# Patient Record
Sex: Female | Born: 1970 | Race: Black or African American | Hispanic: No | Marital: Married | Smoking: Former smoker
Health system: Southern US, Community
[De-identification: ages and names within clinical notes are randomized; demographics above are authoritative.]

## PROBLEM LIST (undated history)

## (undated) DIAGNOSIS — D649 Anemia, unspecified: Secondary | ICD-10-CM

## (undated) DIAGNOSIS — E119 Type 2 diabetes mellitus without complications: Secondary | ICD-10-CM

## (undated) DIAGNOSIS — I10 Essential (primary) hypertension: Secondary | ICD-10-CM

## (undated) HISTORY — PX: WISDOM TOOTH EXTRACTION: SHX21

---

## 2018-03-01 DIAGNOSIS — E1159 Type 2 diabetes mellitus with other circulatory complications: Secondary | ICD-10-CM | POA: Insufficient documentation

## 2018-03-01 DIAGNOSIS — E1165 Type 2 diabetes mellitus with hyperglycemia: Secondary | ICD-10-CM | POA: Insufficient documentation

## 2018-03-01 DIAGNOSIS — I10 Essential (primary) hypertension: Secondary | ICD-10-CM | POA: Insufficient documentation

## 2018-03-01 DIAGNOSIS — Z794 Long term (current) use of insulin: Secondary | ICD-10-CM

## 2018-03-01 DIAGNOSIS — E119 Type 2 diabetes mellitus without complications: Secondary | ICD-10-CM

## 2018-03-01 HISTORY — DX: Type 2 diabetes mellitus without complications: Z79.4

## 2018-03-01 HISTORY — DX: Type 2 diabetes mellitus without complications: E11.9

## 2019-09-03 ENCOUNTER — Other Ambulatory Visit: Payer: Self-pay | Admitting: Obstetrics & Gynecology

## 2019-09-03 DIAGNOSIS — R1900 Intra-abdominal and pelvic swelling, mass and lump, unspecified site: Secondary | ICD-10-CM

## 2019-09-09 ENCOUNTER — Ambulatory Visit
Admission: RE | Admit: 2019-09-09 | Discharge: 2019-09-09 | Disposition: A | Discharge status: Home / Self Care | Payer: Managed Care, Other (non HMO) | Source: Ambulatory Visit | Attending: Obstetrics & Gynecology | Admitting: Obstetrics & Gynecology

## 2019-09-09 DIAGNOSIS — R1900 Intra-abdominal and pelvic swelling, mass and lump, unspecified site: Secondary | ICD-10-CM

## 2019-09-09 MED ORDER — IOPAMIDOL (ISOVUE-300) INJECTION 61%
100.0000 mL | Freq: Once | INTRAVENOUS | Status: AC | PRN
  Administered 2019-09-09: 100 mL via INTRAVENOUS

## 2019-09-20 ENCOUNTER — Other Ambulatory Visit: Payer: Self-pay | Admitting: Obstetrics & Gynecology

## 2019-09-20 DIAGNOSIS — N852 Hypertrophy of uterus: Secondary | ICD-10-CM

## 2019-10-07 ENCOUNTER — Other Ambulatory Visit: Payer: Self-pay

## 2019-10-07 ENCOUNTER — Ambulatory Visit
Admission: RE | Admit: 2019-10-07 | Discharge: 2019-10-07 | Disposition: A | Discharge status: Home / Self Care | Payer: Managed Care, Other (non HMO) | Source: Ambulatory Visit | Attending: Obstetrics & Gynecology | Admitting: Obstetrics & Gynecology

## 2019-10-07 DIAGNOSIS — N852 Hypertrophy of uterus: Secondary | ICD-10-CM

## 2019-10-07 MED ORDER — GADOBENATE DIMEGLUMINE 529 MG/ML IV SOLN
16.0000 mL | Freq: Once | INTRAVENOUS | Status: AC | PRN
  Administered 2019-10-07: 16 mL via INTRAVENOUS

## 2019-11-02 ENCOUNTER — Other Ambulatory Visit: Payer: Self-pay | Admitting: Obstetrics & Gynecology

## 2019-11-10 ENCOUNTER — Other Ambulatory Visit: Payer: Self-pay | Admitting: Obstetrics & Gynecology

## 2019-11-10 NOTE — Progress Notes (Signed)
PCP - Azucena Fallen, MD Cardiologist -   Chest x-ray -  EKG -  Stress Test -  ECHO -  Cardiac Cath -   Sleep Study -  CPAP -   Fasting Blood Sugar -  Checks Blood Sugar _____ times a day  Blood Thinner Instructions: Aspirin Instructions: Last Dose:  Anesthesia review:   Patient denies shortness of breath, fever, cough and chest pain at PAT appointment   Patient verbalized understanding of instructions that were given to them at the PAT appointment. Patient was also instructed that they will need to review over the PAT instructions again at home before surgery.

## 2019-11-10 NOTE — Patient Instructions (Addendum)
YOU ARE SCHEDULED FOR A COVID TEST 11-18-19@3 :30 PM. THIS TEST MUST BE DONE BEFORE SURGERY. GO TO  [address], [place], [address] AND REMAIN IN YOUR CAR, THIS IS A DRIVE UP TEST. ONCE YOUR COVID TEST IS DONE PLEASE FOLLOW ALL THE QUARANTINE  INSTRUCTIONS GIVEN IN YOUR HANDOUT.      Your procedure is scheduled on 11-22-19    Report to [place]. M.   Call this number if you have problems the morning of surgery  :[phone].   OUR ADDRESS IS [place].  WE ARE LOCATED IN [facility].                                     REMEMBER: DO NOT EAT FOOD OR DRINK LIQUIDS AFTER MIDNIGHT .  AFTER MIDNIGHT YOU MAY HAVE A CLEAR LIQUID DIET FROM MIDNIGHT UNTIL 7:15 AM, AFTER 7:15 AM, NOTHING UNTIL AFTER SURGERY.     CLEAR LIQUID DIET   Foods Allowed                                                                     Foods Excluded  Coffee and tea, regular and decaf                             liquids that you cannot  Plain Jell-O any favor except red or purple                                           see through such as: Fruit ices (not with fruit pulp)                                     milk, soups, orange juice  Iced Popsicles                                    All solid food Carbonated beverages, regular and diet                                    Cranberry, grape and apple juices Sports drinks like Gatorade Lightly seasoned clear broth or consume(fat free) Sugar, honey syrup   _____________________________________________________________________     YOU MAY  BRUSH YOUR TEETH MORNING OF SURGERY AND RINSE YOUR MOUTH OUT, NO CHEWING GUM CANDY OR MINTS.   TAKE THESE MEDICATIONS MORNING OF SURGERY WITH A SIP OF WATER:  Amlodipine (Norvas)  IF YOU ARE SPENDING THE NIGHT AFTER SURGERY PLEASE BRING ALL YOUR PRESCRIPTION MEDICATIONS IN THEIR ORIGINAL BOTTLES.   1 VISITOR IS ALLOWED IN WAITING ROOM ONLY DAY OF SURGERY.   NO  VISITOR MAY SPEND THE NIGHT. VISITOR ARE ALLOWED TO STAY UNTIL 800 PM.  DO NOT WEAR JEWERLY, MAKE UP, OR NAIL POLISH ON FINGERNAILS.  DO NOT WEAR LOTIONS, POWDERS, PERFUMES OR DEODORANT.  DO NOT SHAVE FOR 24 HOURS PRIOR TO DAY OF SURGERY.  CONTACTS, GLASSES, OR DENTURES MAY NOT BE WORN TO SURGERY.                                    [facility] IS NOT RESPONSIBLE  FOR ANY BELONGINGS.                                                                    Marland Kitchen     How to Manage Your Diabetes Before and After Surgery  Why is it important to control my blood sugar before and after surgery? . Improving blood sugar levels before and after surgery helps healing and can limit problems. . A way of improving blood sugar control is eating a healthy diet by: o  Eating less sugar and carbohydrates o  Increasing activity/exercise o  Talking with your doctor about reaching your blood sugar goals . High blood sugars (greater than 180 mg/dL) can raise your risk of infections and slow your recovery, so you will need to focus on controlling your diabetes during the weeks before surgery. . Make sure that the doctor who takes care of your diabetes knows about your planned surgery including the date and location.  How do I manage my blood sugar before surgery? . Check your blood sugar at least 4 times a day, starting 2 days before surgery, to make sure that the level is not too high or low. o Check your blood sugar the morning of your surgery when you wake up and every 2 hours until you get to the Short Stay unit. . If your blood sugar is less than 70 mg/dL, you will need to treat for low blood sugar: o Do not take insulin. o Treat a low blood sugar (less than 70 mg/dL) with  cup of clear juice (cranberry or apple), 4 glucose tablets, OR glucose gel. o Recheck blood sugar in 15 minutes after treatment (to make sure it is greater than 70 mg/dL). If your blood sugar is not greater  than 70 mg/dL on recheck, call [phone] for further instructions. . Report your blood sugar to the short stay nurse when you get to Short Stay.  . If you are admitted to the hospital after surgery: o Your blood sugar will be checked by the staff and you will probably be given insulin after surgery (instead of oral diabetes medicines) to make sure you have good blood sugar levels. o The goal for blood sugar control after surgery is 80-180 mg/dL.   WHAT DO I DO ABOUT MY DIABETES MEDICATION?  Marland Kitchen Do not take oral diabetes medicines (pills) the morning of surgery.  . THE DAY BEFORE SURGERY, take your usual 6-8 units of Basaglar and Toujeo insulin, if needed.       . THE MORNING OF SURGERY, take  3-4 units of Basaglar and Toujeo insulin if needed. .   Reviewed and Endorsed by [facility] Patient Education Committee, August 2015  [facility] - Preparing for Surgery Before surgery, you can play an important role.  Because skin is not sterile, your skin needs to be as free of germs as possible.  You can reduce the number of germs on your skin by washing with CHG (chlorahexidine gluconate) soap before surgery.  CHG is an antiseptic cleaner which kills germs and bonds with the skin to continue killing germs even after washing. Please DO NOT use if you have an allergy to CHG or antibacterial soaps.  If your skin becomes reddened/irritated stop using the CHG and inform your nurse when you arrive at Short Stay. Do not shave (including legs and underarms) for at least 48 hours prior to the first CHG shower.  You may shave your face/neck. Please follow these instructions carefully:  1.  Shower with CHG Soap the night before surgery and the  morning of Surgery.  2.  If you choose to wash your hair, wash your hair first as usual with your  normal  shampoo.  3.  After you shampoo, rinse your hair and body  thoroughly to remove the  shampoo.                           4.  Use CHG as you would any other liquid soap.  You can apply chg directly  to the skin and wash                       Gently with a scrungie or clean washcloth.  5.  Apply the CHG Soap to your body ONLY FROM THE NECK DOWN.   Do not use on face/ open                           Wound or open sores. Avoid contact with eyes, ears mouth and genitals (private parts).                       Wash face,  Genitals (private parts) with your normal soap.             6.  Wash thoroughly, paying special attention to the area where your surgery  will be performed.  7.  Thoroughly rinse your body with warm water from the neck down.  8.  DO NOT shower/wash with your normal soap after using and rinsing off  the CHG Soap.                9.  Pat yourself dry with a clean towel.            10.  Wear clean pajamas.            11.  Place clean sheets on your bed the night of your first shower and do not  sleep with pets. Day of Surgery : Do not apply any lotions/deodorants the morning of surgery.  Please wear clean clothes to the hospital/surgery center.  FAILURE TO FOLLOW THESE INSTRUCTIONS MAY RESULT IN THE CANCELLATION OF YOUR SURGERY PATIENT SIGNATURE_________________________________  NURSE SIGNATURE__________________________________  ________________________________________________________________________

## 2019-11-15 ENCOUNTER — Other Ambulatory Visit: Payer: Self-pay

## 2019-11-15 ENCOUNTER — Other Ambulatory Visit (HOSPITAL_COMMUNITY): Payer: Managed Care, Other (non HMO)

## 2019-11-15 ENCOUNTER — Encounter (HOSPITAL_COMMUNITY): Payer: Self-pay

## 2019-11-15 ENCOUNTER — Encounter (HOSPITAL_COMMUNITY)
Admission: RE | Admit: 2019-11-15 | Discharge: 2019-11-15 | Disposition: A | Discharge status: Home / Self Care | Payer: Managed Care, Other (non HMO) | Source: Ambulatory Visit | Attending: Obstetrics & Gynecology | Admitting: Obstetrics & Gynecology

## 2019-11-15 DIAGNOSIS — E118 Type 2 diabetes mellitus with unspecified complications: Secondary | ICD-10-CM | POA: Insufficient documentation

## 2019-11-15 DIAGNOSIS — D259 Leiomyoma of uterus, unspecified: Secondary | ICD-10-CM | POA: Insufficient documentation

## 2019-11-15 DIAGNOSIS — I10 Essential (primary) hypertension: Secondary | ICD-10-CM | POA: Diagnosis not present

## 2019-11-15 DIAGNOSIS — Z01818 Encounter for other preprocedural examination: Secondary | ICD-10-CM | POA: Diagnosis present

## 2019-11-15 HISTORY — DX: Anemia, unspecified: D64.9

## 2019-11-15 HISTORY — DX: Essential (primary) hypertension: I10

## 2019-11-15 HISTORY — DX: Type 2 diabetes mellitus without complications: E11.9

## 2019-11-15 LAB — ABO/RH: ABO/RH(D): A POS

## 2019-11-15 LAB — COMPREHENSIVE METABOLIC PANEL
ALT: 106 U/L — ABNORMAL HIGH (ref 0–44)
AST: 36 U/L (ref 15–41)
Albumin: 3.9 g/dL (ref 3.5–5.0)
Alkaline Phosphatase: 54 U/L (ref 38–126)
Anion gap: 8 (ref 5–15)
BUN: 16 mg/dL (ref 6–20)
CO2: 28 mmol/L (ref 22–32)
Calcium: 9.4 mg/dL (ref 8.9–10.3)
Chloride: 105 mmol/L (ref 98–111)
Creatinine, Ser: 0.83 mg/dL (ref 0.44–1.00)
GFR calc Af Amer: >60 mL/min (ref >60)
GFR calc non Af Amer: >60 mL/min (ref >60)
Glucose, Bld: 139 mg/dL — ABNORMAL HIGH (ref 70–99)
Potassium: 4.2 mmol/L (ref 3.5–5.1)
Sodium: 141 mmol/L (ref 135–145)
Total Bilirubin: 0.5 mg/dL (ref 0.3–1.2)
Total Protein: 7.4 g/dL (ref 6.5–8.1)

## 2019-11-15 LAB — CBC
HCT: 43.1 % (ref 36.0–46.0)
Hemoglobin: 13.6 g/dL (ref 12.0–15.0)
MCH: 26.3 pg (ref 26.0–34.0)
MCHC: 31.6 g/dL (ref 30.0–36.0)
MCV: 83.2 fL (ref 80.0–100.0)
Platelets: 389 10*3/uL (ref 150–400)
RBC: 5.18 MIL/uL — ABNORMAL HIGH (ref 3.87–5.11)
RDW: 19 % — ABNORMAL HIGH (ref 11.5–15.5)
WBC: 6.1 10*3/uL (ref 4.0–10.5)
nRBC: 0 % (ref 0.0–0.2)

## 2019-11-15 LAB — HEMOGLOBIN A1C
Hgb A1c MFr Bld: 8 % — ABNORMAL HIGH (ref 4.8–5.6)
Mean Plasma Glucose: 182.9 mg/dL

## 2019-11-15 LAB — GLUCOSE, CAPILLARY: Glucose-Capillary: 144 mg/dL — ABNORMAL HIGH (ref 70–99)

## 2019-11-16 ENCOUNTER — Other Ambulatory Visit: Payer: Self-pay | Admitting: Obstetrics & Gynecology

## 2019-11-18 ENCOUNTER — Other Ambulatory Visit (HOSPITAL_COMMUNITY)
Admission: RE | Admit: 2019-11-18 | Discharge: 2019-11-18 | Disposition: A | Discharge status: Home / Self Care | Payer: Managed Care, Other (non HMO) | Source: Ambulatory Visit | Attending: Obstetrics & Gynecology | Admitting: Obstetrics & Gynecology

## 2019-11-18 DIAGNOSIS — Z01812 Encounter for preprocedural laboratory examination: Secondary | ICD-10-CM | POA: Insufficient documentation

## 2019-11-18 DIAGNOSIS — Z20822 Contact with and (suspected) exposure to covid-19: Secondary | ICD-10-CM | POA: Diagnosis not present

## 2019-11-19 LAB — SARS CORONAVIRUS 2 (TAT 6-24 HRS): SARS Coronavirus 2: NEGATIVE

## 2019-11-22 ENCOUNTER — Encounter (HOSPITAL_BASED_OUTPATIENT_CLINIC_OR_DEPARTMENT_OTHER): Payer: Self-pay | Admitting: Obstetrics & Gynecology

## 2019-11-22 ENCOUNTER — Inpatient Hospital Stay (HOSPITAL_COMMUNITY)
Admission: AD | Admit: 2019-11-22 | Discharge: 2019-11-24 | DRG: 742 | Disposition: A | Discharge status: Home / Self Care | Payer: Managed Care, Other (non HMO) | Source: Ambulatory Visit | Attending: Obstetrics & Gynecology | Admitting: Obstetrics & Gynecology

## 2019-11-22 ENCOUNTER — Ambulatory Visit (HOSPITAL_BASED_OUTPATIENT_CLINIC_OR_DEPARTMENT_OTHER): Payer: Managed Care, Other (non HMO) | Admitting: Anesthesiology

## 2019-11-22 ENCOUNTER — Encounter (HOSPITAL_COMMUNITY)
Admission: AD | Disposition: A | Discharge status: Home / Self Care | Payer: Self-pay | Source: Ambulatory Visit | Attending: Obstetrics & Gynecology

## 2019-11-22 ENCOUNTER — Other Ambulatory Visit: Payer: Self-pay

## 2019-11-22 DIAGNOSIS — D252 Subserosal leiomyoma of uterus: Principal | ICD-10-CM | POA: Diagnosis present

## 2019-11-22 DIAGNOSIS — E119 Type 2 diabetes mellitus without complications: Secondary | ICD-10-CM | POA: Diagnosis present

## 2019-11-22 DIAGNOSIS — E861 Hypovolemia: Secondary | ICD-10-CM | POA: Diagnosis not present

## 2019-11-22 DIAGNOSIS — Z87891 Personal history of nicotine dependence: Secondary | ICD-10-CM

## 2019-11-22 DIAGNOSIS — Z794 Long term (current) use of insulin: Secondary | ICD-10-CM

## 2019-11-22 DIAGNOSIS — N179 Acute kidney failure, unspecified: Secondary | ICD-10-CM | POA: Diagnosis not present

## 2019-11-22 DIAGNOSIS — Y658 Other specified misadventures during surgical and medical care: Secondary | ICD-10-CM | POA: Diagnosis not present

## 2019-11-22 DIAGNOSIS — K660 Peritoneal adhesions (postprocedural) (postinfection): Secondary | ICD-10-CM | POA: Diagnosis present

## 2019-11-22 DIAGNOSIS — D259 Leiomyoma of uterus, unspecified: Secondary | ICD-10-CM | POA: Diagnosis present

## 2019-11-22 DIAGNOSIS — I9581 Postprocedural hypotension: Secondary | ICD-10-CM | POA: Diagnosis not present

## 2019-11-22 DIAGNOSIS — D62 Acute posthemorrhagic anemia: Secondary | ICD-10-CM | POA: Diagnosis not present

## 2019-11-22 DIAGNOSIS — Z9889 Other specified postprocedural states: Secondary | ICD-10-CM

## 2019-11-22 DIAGNOSIS — K9172 Accidental puncture and laceration of a digestive system organ or structure during other procedure: Secondary | ICD-10-CM | POA: Diagnosis not present

## 2019-11-22 DIAGNOSIS — I10 Essential (primary) hypertension: Secondary | ICD-10-CM | POA: Diagnosis present

## 2019-11-22 DIAGNOSIS — K388 Other specified diseases of appendix: Secondary | ICD-10-CM | POA: Diagnosis present

## 2019-11-22 HISTORY — PX: MYOMECTOMY: SHX85

## 2019-11-22 HISTORY — PX: APPENDECTOMY: SHX54

## 2019-11-22 LAB — CBC
HCT: 27.8 % — ABNORMAL LOW (ref 36.0–46.0)
HCT: 23.8 % — ABNORMAL LOW (ref 36.0–46.0)
Hemoglobin: 8.9 g/dL — ABNORMAL LOW (ref 12.0–15.0)
Hemoglobin: 7.6 g/dL — ABNORMAL LOW (ref 12.0–15.0)
MCH: 26.9 pg (ref 26.0–34.0)
MCH: 27 pg (ref 26.0–34.0)
MCHC: 32 g/dL (ref 30.0–36.0)
MCHC: 31.9 g/dL (ref 30.0–36.0)
MCV: 84 fL (ref 80.0–100.0)
MCV: 84.4 fL (ref 80.0–100.0)
Platelets: 340 10*3/uL (ref 150–400)
Platelets: 303 10*3/uL (ref 150–400)
RBC: 3.31 MIL/uL — ABNORMAL LOW (ref 3.87–5.11)
RBC: 2.82 MIL/uL — ABNORMAL LOW (ref 3.87–5.11)
RDW: 18.3 % — ABNORMAL HIGH (ref 11.5–15.5)
RDW: 18.3 % — ABNORMAL HIGH (ref 11.5–15.5)
WBC: 19.2 10*3/uL — ABNORMAL HIGH (ref 4.0–10.5)
WBC: 20.2 10*3/uL — ABNORMAL HIGH (ref 4.0–10.5)
nRBC: 0 % (ref 0.0–0.2)
nRBC: 0 % (ref 0.0–0.2)

## 2019-11-22 LAB — BASIC METABOLIC PANEL
Anion gap: 11 (ref 5–15)
BUN: 15 mg/dL (ref 6–20)
CO2: 22 mmol/L (ref 22–32)
Calcium: 8.1 mg/dL — ABNORMAL LOW (ref 8.9–10.3)
Chloride: 105 mmol/L (ref 98–111)
Creatinine, Ser: 0.77 mg/dL (ref 0.44–1.00)
GFR calc Af Amer: >60 mL/min (ref >60)
GFR calc non Af Amer: >60 mL/min (ref >60)
Glucose, Bld: 239 mg/dL — ABNORMAL HIGH (ref 70–99)
Potassium: 3.7 mmol/L (ref 3.5–5.1)
Sodium: 138 mmol/L (ref 135–145)

## 2019-11-22 LAB — POCT I-STAT, CHEM 8
BUN: 17 mg/dL (ref 6–20)
Calcium, Ion: 1.26 mmol/L (ref 1.15–1.40)
Chloride: 102 mmol/L (ref 98–111)
Creatinine, Ser: 0.8 mg/dL (ref 0.44–1.00)
Glucose, Bld: 97 mg/dL (ref 70–99)
HCT: 43 % (ref 36.0–46.0)
Hemoglobin: 14.6 g/dL (ref 12.0–15.0)
Potassium: 3.9 mmol/L (ref 3.5–5.1)
Sodium: 140 mmol/L (ref 135–145)
TCO2: 29 mmol/L (ref 22–32)

## 2019-11-22 LAB — LACTIC ACID, PLASMA: Lactic Acid, Venous: 2.2 mmol/L (ref 0.5–1.9)

## 2019-11-22 LAB — GLUCOSE, CAPILLARY
Glucose-Capillary: 111 mg/dL — ABNORMAL HIGH (ref 70–99)
Glucose-Capillary: 167 mg/dL — ABNORMAL HIGH (ref 70–99)
Glucose-Capillary: 248 mg/dL — ABNORMAL HIGH (ref 70–99)

## 2019-11-22 LAB — POCT PREGNANCY, URINE: Preg Test, Ur: NEGATIVE

## 2019-11-22 LAB — PREPARE RBC (CROSSMATCH)

## 2019-11-22 SURGERY — MYOMECTOMY, ABDOMINAL APPROACH
Anesthesia: General | Site: Abdomen

## 2019-11-22 MED ORDER — PHENYLEPHRINE 40 MCG/ML (10ML) SYRINGE FOR IV PUSH (FOR BLOOD PRESSURE SUPPORT)
PREFILLED_SYRINGE | INTRAVENOUS | Status: AC
  Filled 2019-11-22: qty 10

## 2019-11-22 MED ORDER — KETOROLAC TROMETHAMINE 30 MG/ML IJ SOLN
30.0000 mg | Freq: Once | INTRAMUSCULAR | Status: AC
  Administered 2019-11-22: 30 mg via INTRAVENOUS
  Filled 2019-11-22: qty 1

## 2019-11-22 MED ORDER — ACETAMINOPHEN 325 MG PO TABS: 650.0000 mg | ORAL_TABLET | ORAL | Status: DC | PRN

## 2019-11-22 MED ORDER — LIDOCAINE 2% (20 MG/ML) 5 ML SYRINGE
INTRAMUSCULAR | Status: AC
  Filled 2019-11-22: qty 5

## 2019-11-22 MED ORDER — LACTATED RINGERS IV BOLUS: 500.0000 mL | Freq: Once | INTRAVENOUS | Status: DC

## 2019-11-22 MED ORDER — BUPIVACAINE-EPINEPHRINE (PF) 0.5% -1:200000 IJ SOLN
INTRAMUSCULAR | Status: DC | PRN
  Administered 2019-11-22: 30 mL

## 2019-11-22 MED ORDER — FENTANYL CITRATE (PF) 250 MCG/5ML IJ SOLN
INTRAMUSCULAR | Status: AC
  Filled 2019-11-22: qty 5

## 2019-11-22 MED ORDER — DEXAMETHASONE SODIUM PHOSPHATE 10 MG/ML IJ SOLN
INTRAMUSCULAR | Status: AC
  Filled 2019-11-22: qty 1

## 2019-11-22 MED ORDER — MIDAZOLAM HCL 2 MG/2ML IJ SOLN
INTRAMUSCULAR | Status: DC | PRN
  Administered 2019-11-22: 2 mg via INTRAVENOUS

## 2019-11-22 MED ORDER — LIDOCAINE-EPINEPHRINE 2 %-1:100000 IJ SOLN
INTRAMUSCULAR | Status: DC | PRN
  Administered 2019-11-22: 20 mL

## 2019-11-22 MED ORDER — DOCUSATE SODIUM 100 MG PO CAPS
100.0000 mg | ORAL_CAPSULE | Freq: Two times a day (BID) | ORAL | Status: DC
  Administered 2019-11-23 – 2019-11-24 (×3): 100 mg via ORAL
  Filled 2019-11-22 (×3): qty 1

## 2019-11-22 MED ORDER — DEXAMETHASONE SODIUM PHOSPHATE 10 MG/ML IJ SOLN
INTRAMUSCULAR | Status: DC | PRN
  Administered 2019-11-22: 4 mg via INTRAVENOUS

## 2019-11-22 MED ORDER — ONDANSETRON HCL 4 MG PO TABS: 4.0000 mg | ORAL_TABLET | Freq: Four times a day (QID) | ORAL | Status: DC | PRN

## 2019-11-22 MED ORDER — ACETAMINOPHEN 325 MG PO TABS
650.0000 mg | ORAL_TABLET | Freq: Once | ORAL | Status: AC
  Administered 2019-11-23: 650 mg via ORAL
  Filled 2019-11-22 (×2): qty 2

## 2019-11-22 MED ORDER — HYDROMORPHONE HCL 1 MG/ML IJ SOLN: 0.5000 mg | INTRAMUSCULAR | Status: DC | PRN

## 2019-11-22 MED ORDER — PROPOFOL 10 MG/ML IV BOLUS
INTRAVENOUS | Status: DC | PRN
  Administered 2019-11-22: 100 mg via INTRAVENOUS

## 2019-11-22 MED ORDER — PHENYLEPHRINE 40 MCG/ML (10ML) SYRINGE FOR IV PUSH (FOR BLOOD PRESSURE SUPPORT)
PREFILLED_SYRINGE | INTRAVENOUS | Status: DC | PRN
  Administered 2019-11-22: 80 ug via INTRAVENOUS
  Administered 2019-11-22 (×2): 120 ug via INTRAVENOUS
  Administered 2019-11-22: 80 ug via INTRAVENOUS

## 2019-11-22 MED ORDER — ONDANSETRON HCL 4 MG/2ML IJ SOLN
INTRAMUSCULAR | Status: AC
  Filled 2019-11-22: qty 2

## 2019-11-22 MED ORDER — ROCURONIUM BROMIDE 10 MG/ML (PF) SYRINGE
PREFILLED_SYRINGE | INTRAVENOUS | Status: DC | PRN
  Administered 2019-11-22: 100 mg via INTRAVENOUS

## 2019-11-22 MED ORDER — CEFAZOLIN SODIUM-DEXTROSE 2-4 GM/100ML-% IV SOLN
INTRAVENOUS | Status: AC
  Filled 2019-11-22: qty 100

## 2019-11-22 MED ORDER — INSULIN GLARGINE 100 UNIT/ML ~~LOC~~ SOLN
6.0000 [IU] | Freq: Every day | SUBCUTANEOUS | Status: DC
  Filled 2019-11-22 (×2): qty 0.06

## 2019-11-22 MED ORDER — FENTANYL CITRATE (PF) 100 MCG/2ML IJ SOLN
INTRAMUSCULAR | Status: DC | PRN
  Administered 2019-11-22 (×3): 50 ug via INTRAVENOUS
  Administered 2019-11-22: 100 ug via INTRAVENOUS

## 2019-11-22 MED ORDER — ALBUMIN HUMAN 5 % IV SOLN: INTRAVENOUS | Status: DC | PRN

## 2019-11-22 MED ORDER — MENTHOL 3 MG MT LOZG: 1.0000 | LOZENGE | OROMUCOSAL | Status: DC | PRN

## 2019-11-22 MED ORDER — AMLODIPINE BESYLATE 5 MG PO TABS
2.5000 mg | ORAL_TABLET | Freq: Every day | ORAL | Status: DC
  Administered 2019-11-24: 2.5 mg via ORAL
  Filled 2019-11-22: qty 1

## 2019-11-22 MED ORDER — ONDANSETRON HCL 4 MG/2ML IJ SOLN: 4.0000 mg | Freq: Four times a day (QID) | INTRAMUSCULAR | Status: DC | PRN

## 2019-11-22 MED ORDER — MEPERIDINE HCL 25 MG/ML IJ SOLN
6.2500 mg | INTRAMUSCULAR | Status: DC | PRN
  Filled 2019-11-22: qty 1

## 2019-11-22 MED ORDER — LACTATED RINGERS IV SOLN: INTRAVENOUS | Status: DC

## 2019-11-22 MED ORDER — VASOPRESSIN 20 UNIT/ML IV SOLN
INTRAVENOUS | Status: DC | PRN
  Administered 2019-11-22: 40 [IU]

## 2019-11-22 MED ORDER — BASAGLAR KWIKPEN 100 UNIT/ML ~~LOC~~ SOPN: 6.0000 [IU] | PEN_INJECTOR | Freq: Every day | SUBCUTANEOUS | Status: DC

## 2019-11-22 MED ORDER — FENTANYL CITRATE (PF) 100 MCG/2ML IJ SOLN
INTRAMUSCULAR | Status: AC
  Filled 2019-11-22: qty 2

## 2019-11-22 MED ORDER — SODIUM CHLORIDE 0.9% IV SOLUTION
Freq: Once | INTRAVENOUS | Status: AC
  Filled 2019-11-22: qty 250

## 2019-11-22 MED ORDER — HYDROMORPHONE HCL 1 MG/ML IJ SOLN
INTRAMUSCULAR | Status: AC
  Filled 2019-11-22: qty 1

## 2019-11-22 MED ORDER — PROPOFOL 10 MG/ML IV BOLUS
INTRAVENOUS | Status: AC
  Filled 2019-11-22: qty 20

## 2019-11-22 MED ORDER — DIPHENHYDRAMINE HCL 50 MG/ML IJ SOLN
25.0000 mg | Freq: Once | INTRAMUSCULAR | Status: DC
  Filled 2019-11-22: qty 1
  Filled 2019-11-22: qty 0.5

## 2019-11-22 MED ORDER — LIDOCAINE 2% (20 MG/ML) 5 ML SYRINGE
INTRAMUSCULAR | Status: DC | PRN
  Administered 2019-11-22: 100 mg via INTRAVENOUS

## 2019-11-22 MED ORDER — KETOROLAC TROMETHAMINE 30 MG/ML IJ SOLN
INTRAMUSCULAR | Status: AC
  Filled 2019-11-22: qty 1

## 2019-11-22 MED ORDER — MIDAZOLAM HCL 2 MG/2ML IJ SOLN
INTRAMUSCULAR | Status: AC
  Filled 2019-11-22: qty 2

## 2019-11-22 MED ORDER — MIDAZOLAM HCL 2 MG/2ML IJ SOLN
2.0000 mg | Freq: Once | INTRAMUSCULAR | Status: AC
  Administered 2019-11-22: 2 mg via INTRAVENOUS
  Filled 2019-11-22: qty 2

## 2019-11-22 MED ORDER — SUGAMMADEX SODIUM 200 MG/2ML IV SOLN
INTRAVENOUS | Status: DC | PRN
  Administered 2019-11-22: 160 mg via INTRAVENOUS

## 2019-11-22 MED ORDER — FENTANYL CITRATE (PF) 100 MCG/2ML IJ SOLN
50.0000 ug | Freq: Once | INTRAMUSCULAR | Status: AC
  Administered 2019-11-22: 50 ug via INTRAVENOUS
  Filled 2019-11-22: qty 1

## 2019-11-22 MED ORDER — TRANEXAMIC ACID-NACL 1000-0.7 MG/100ML-% IV SOLN
1000.0000 mg | INTRAVENOUS | Status: AC
  Administered 2019-11-22: 1000 mg via INTRAVENOUS
  Filled 2019-11-22 (×3): qty 100

## 2019-11-22 MED ORDER — ONDANSETRON HCL 4 MG/2ML IJ SOLN
4.0000 mg | Freq: Once | INTRAMUSCULAR | Status: DC | PRN
  Filled 2019-11-22: qty 2

## 2019-11-22 MED ORDER — HYDROMORPHONE HCL 1 MG/ML IJ SOLN
0.2500 mg | INTRAMUSCULAR | Status: DC | PRN
  Administered 2019-11-22: 0.25 mg via INTRAVENOUS
  Filled 2019-11-22: qty 0.5

## 2019-11-22 MED ORDER — ONDANSETRON HCL 4 MG/2ML IJ SOLN
INTRAMUSCULAR | Status: DC | PRN
  Administered 2019-11-22 (×2): 4 mg via INTRAVENOUS

## 2019-11-22 MED ORDER — CEFAZOLIN SODIUM-DEXTROSE 2-4 GM/100ML-% IV SOLN
2.0000 g | INTRAVENOUS | Status: AC
  Administered 2019-11-22: 2 g via INTRAVENOUS
  Filled 2019-11-22: qty 100

## 2019-11-22 MED ORDER — TRANEXAMIC ACID-NACL 1000-0.7 MG/100ML-% IV SOLN
INTRAVENOUS | Status: DC | PRN
  Administered 2019-11-22: 1000 mg via INTRAVENOUS

## 2019-11-22 MED ORDER — OXYCODONE HCL 5 MG PO TABS: 5.0000 mg | ORAL_TABLET | ORAL | Status: DC | PRN

## 2019-11-22 MED ORDER — LACTATED RINGERS IV SOLN
INTRAVENOUS | Status: DC
  Filled 2019-11-22: qty 1000

## 2019-11-22 MED ORDER — ROCURONIUM BROMIDE 10 MG/ML (PF) SYRINGE
PREFILLED_SYRINGE | INTRAVENOUS | Status: AC
  Filled 2019-11-22: qty 10

## 2019-11-22 SURGICAL SUPPLY — 52 items
BARRIER ADHS 3X4 INTERCEED (GAUZE/BANDAGES/DRESSINGS) ×2 IMPLANT
BENZOIN TINCTURE PRP APPL 2/3 (GAUZE/BANDAGES/DRESSINGS) ×2 IMPLANT
BLADE SURG 10 STRL SS (BLADE) ×4 IMPLANT
CANISTER SUCT 3000ML PPV (MISCELLANEOUS) ×3 IMPLANT
CLOSURE STERI-STRIP 1/2X4 (GAUZE/BANDAGES/DRESSINGS) ×1
CLOSURE WOUND 1/2 X4 (GAUZE/BANDAGES/DRESSINGS) ×2
CLSR STERI-STRIP ANTIMIC 1/2X4 (GAUZE/BANDAGES/DRESSINGS) ×1 IMPLANT
CUTTER FLEX LINEAR 45M (STAPLE) ×2 IMPLANT
DECANTER SPIKE VIAL GLASS SM (MISCELLANEOUS) ×3 IMPLANT
DRAIN PENROSE 0.5X18 (DRAIN) ×2 IMPLANT
DRAPE CESAREAN BIRTH W POUCH (DRAPES) ×3 IMPLANT
DRSG OPSITE POSTOP 4X10 (GAUZE/BANDAGES/DRESSINGS) ×2 IMPLANT
DURAPREP 26ML APPLICATOR (WOUND CARE) ×3 IMPLANT
ELECT NDL TIP 2.8 STRL (NEEDLE) IMPLANT
ELECT NEEDLE TIP 2.8 STRL (NEEDLE) IMPLANT
GAUZE 4X4 16PLY RFD (DISPOSABLE) IMPLANT
GLOVE BIO SURGEON STRL SZ7 (GLOVE) ×3 IMPLANT
GLOVE BIOGEL PI IND STRL 7.0 (GLOVE) ×2 IMPLANT
GLOVE BIOGEL PI INDICATOR 7.0 (GLOVE) ×4
GOWN STRL REUS W/ TWL LRG LVL3 (GOWN DISPOSABLE) ×3 IMPLANT
GOWN STRL REUS W/TWL LRG LVL3 (GOWN DISPOSABLE) ×6
HEMOSTAT ARISTA ABSORB 3G PWDR (HEMOSTASIS) ×4 IMPLANT
KIT TURNOVER CYSTO (KITS) ×3 IMPLANT
NDL FILTER BLUNT 18X1 1/2 (NEEDLE) ×1 IMPLANT
NEEDLE FILTER BLUNT 18X 1/2SAF (NEEDLE) ×2
NEEDLE FILTER BLUNT 18X1 1/2 (NEEDLE) ×1 IMPLANT
NEEDLE HYPO 22GX1.5 SAFETY (NEEDLE) ×6 IMPLANT
NS IRRIG 1000ML POUR BTL (IV SOLUTION) ×3 IMPLANT
PACK ABDOMINAL GYN (CUSTOM PROCEDURE TRAY) ×3 IMPLANT
PAD OB MATERNITY 4.3X12.25 (PERSONAL CARE ITEMS) ×3 IMPLANT
RELOAD 45 VASCULAR/THIN (ENDOMECHANICALS) ×3 IMPLANT
RELOAD STAPLE 45 2.5 WHT GRN (ENDOMECHANICALS) IMPLANT
SPONGE LAP 18X18 RF (DISPOSABLE) ×2 IMPLANT
STAPLER VISISTAT 35W (STAPLE) IMPLANT
STRIP CLOSURE SKIN 1/2X4 (GAUZE/BANDAGES/DRESSINGS) ×2 IMPLANT
SUT MON AB 2-0 CT1 36 (SUTURE) ×3 IMPLANT
SUT MON AB 2-0 SH 27 (SUTURE) ×2
SUT MON AB 2-0 SH27 (SUTURE) ×1 IMPLANT
SUT MON AB 3-0 SH 27 (SUTURE)
SUT MON AB 3-0 SH27 (SUTURE) IMPLANT
SUT SILK 2 0 SH CR/8 (SUTURE) ×4 IMPLANT
SUT SILK 3 0 SH CR/8 (SUTURE) ×2 IMPLANT
SUT VIC AB 0 CT1 27 (SUTURE) ×8
SUT VIC AB 0 CT1 27XBRD ANBCTR (SUTURE) ×2 IMPLANT
SUT VIC AB 0 CT1 27XCR 8 STRN (SUTURE) IMPLANT
SUT VIC AB 0 CT1 36 (SUTURE) ×4 IMPLANT
SUT VIC AB 3-0 CT1 36 (SUTURE) ×2 IMPLANT
SUT VIC AB 4-0 KS 27 (SUTURE) ×3 IMPLANT
SYR BULB IRRIGATION 50ML (SYRINGE) ×2 IMPLANT
SYR CONTROL 10ML LL (SYRINGE) ×6 IMPLANT
TOWEL OR 17X26 10 PK STRL BLUE (TOWEL DISPOSABLE) ×6 IMPLANT
TRAY FOLEY W/BAG SLVR 14FR (SET/KITS/TRAYS/PACK) ×3 IMPLANT

## 2019-11-22 NOTE — Anesthesia Preprocedure Evaluation (Signed)
Anesthesia Evaluation  Patient identified by MRN, date of birth, ID band Patient awake    Reviewed: Allergy & Precautions, NPO status , Patient's Chart, lab work & pertinent test results  Airway Mallampati: I  TM Distance: >3 FB Neck ROM: Full    Dental   Pulmonary former smoker,    Pulmonary exam normal        Cardiovascular hypertension, Pt. on medications Normal cardiovascular exam     Neuro/Psych    GI/Hepatic   Endo/Other  diabetes, Type 2, Insulin Dependent  Renal/GU      Musculoskeletal   Abdominal   Peds  Hematology   Anesthesia Other Findings   Reproductive/Obstetrics                             Anesthesia Physical Anesthesia Plan  ASA: II  Anesthesia Plan: General   Post-op Pain Management:  Regional for Post-op pain   Induction: Intravenous  PONV Risk Score and Plan: 3 and Ondansetron, Midazolam and Treatment may vary due to age or medical condition  Airway Management Planned: Oral ETT  Additional Equipment:   Intra-op Plan:   Post-operative Plan: Extubation in OR  Informed Consent: I have reviewed the patients History and Physical, chart, labs and discussed the procedure including the risks, benefits and alternatives for the proposed anesthesia with the patient or authorized representative who has indicated his/her understanding and acceptance.       Plan Discussed with: CRNA and Surgeon  Anesthesia Plan Comments:         Anesthesia Quick Evaluation

## 2019-11-22 NOTE — Progress Notes (Signed)
Assisted Dr. Conrad Kenton Vale with ultrasound guided, transabdominal plane block. Side rails up, monitors on throughout procedure. See vital signs in flow sheet. Tolerated Procedure well.

## 2019-11-22 NOTE — Op Note (Signed)
11/22/2019  3:58 PM  PATIENT:  Sylvia Robinson  49 y.o. female  PRE-OPERATIVE DIAGNOSIS:  Uterine Fibroids  POST-OPERATIVE DIAGNOSIS:  Uterine Fibroids  PROCEDURE:  Procedure(s): ABDOMINAL MYOMECTOMY - Dr Benjie Karvonen & Dr Ronita Hipps APPENDECTOMY AND REPAIR OF Small bowel MESENTARY DEFECT - Dr Redmond Pulling  SURGEON:  Surgeon(s): Azucena Fallen, MD Brien Few, MD Greer Pickerel, MD  ASSISTANTS: Dr Benjie Karvonen   ANESTHESIA:   general  DRAINS: none   LOCAL MEDICATIONS USED:  OTHER see dr Gardiner Coins op note  SPECIMEN:  Source of Specimen:  appendix (my portion)  DISPOSITION OF SPECIMEN:  PATHOLOGY  COUNTS:  YES  INDICATION FOR PROCEDURE: 49 year old female who was undergoing an open abdominal myomectomy for a very large uterine fibroid.  The uterine fibroid was displacing the distal small bowel and the appendix.  Per the gynecologist the uterine fibroid was densely tethered and adhered to the distal terminal ileum posterior mesentery as well as the appendix.  In freeing the uterine fibroid there was some bleeding from the mesoappendix as well as from the distal small bowel mesentery and I was called in to evaluate the bowel.  Therefore this was an intraoperative consultation.  PROCEDURE: Please see Dr. Gardiner Coins operative note for additional details.  Upon my arrival the uterine fibroid had been removed and they were closing the uterus.  There was a clamp on the mesoappendix.  I then scrubbed in.  Identified the cecum and the distal small bowel.  The cecum was well perfused.  There is no evidence of ischemia to the distal small bowel.  There was a 6 cm defect in the small bowel mesentery in the distal ileum without vascular compromise to the bowel.  There was some oozing along the posterior mesentery in the same location and the peritoneum of the mesentery had been disrupted along the posterior veil with some oozing of in that area.  I continue to run the small bowel back proximately 150 to 200 cm.  There is  no evidence of enterotomy or bowel injury.  I decided to repair the mesenteric defect in the distal small bowel in order to prevent an internal hernia.  The mesenteric defect was closed with several figure-of-eight 2-0 silk sutures.  We then lifted up the distal small bowel and reflected and looked at the posterior distal small bowel mesentery where the peritoneum had been disrupted.  There is some oozing in 1 location I obtained hemostasis with a 2-0 silk suture.  I then took the clamp off the mesoappendix.  The majority of the blood supply to the appendix had been compromised.  I decided to go ahead and perform appendectomy.  The remaining mesoappendix was taken down with a Claiborne Billings and tied proximally with a 2-0 silk suture.  The appendix was then divided from the cecum with an laparoscopic Endo GIA 45 mm stapler with a white load.  There was a little bit of oozing from the proximal staple line and this was oversewn with a interrupted 3-0 silk suture.  There was excellent hemostasis.  I then turned my attention to the distal small bowel where we closed the mesenteric defect.  Posteriorly there is still little bit oozing at the base of the distal small bowel mesentery.  There is no overt vessel that was exposed or bleeding.  Just some raw oozing.  We placed Arista surgical powder and then I brought up the peritoneal flap and sutured it back to the posterior small bowel mesentery with several interrupted silk sutures.  We then reinspected the bowel.  There is no evidence of vascular compromise of the distal small bowel.  At this point I scrubbed out and the gynecologist proceeded with closure.  Please see the operative note for additional details.  I discussed the surgical findings with the husband and rationale for performing appendectomy as well as the mesenteric closure.  I recommended checking a CBC postoperatively and if the patient had a low hemoglobin that the patient would probably need to be transferred to  [facility] main hospital for overnight observation as opposed to staying in [facility] surgical center  PLAN OF CARE: Admit for overnight observation  PATIENT DISPOSITION:  PACU - hemodynamically stable.   Delay start of Pharmacological VTE agent (>24hrs) due to surgical blood loss or risk of bleeding:  yes  Leighton Ruff. Redmond Pulling, MD, FACS General, Bariatric, & Minimally Invasive Surgery [facility], [place]

## 2019-11-22 NOTE — Progress Notes (Addendum)
Came to assess pt for Hypotension and tachycardia. Last Dilaudid given at 5.45 pm 0.25 mg Pt oriented but sleepy, responds to verbal conversation appropriately. Reports bladder pressure, urinary urgency. No CP/ SOB  Patient Vitals for the past 24 hrs:  BP Temp Pulse Resp SpO2  11/22/19 2015 97/71 -- (!) 110 (!) 29 100 %  11/22/19 2000 101/73 -- (!) 111 (!) 28 100 %  11/22/19 1945 99/71 -- (!) 109 (!) 31 100 %  11/22/19 1930 93/74 -- (!) 110 (!) 29 100 %  11/22/19 1915 100/71 -- (!) 110 (!) 30 100 %  11/22/19 1900 111/76 -- (!) 114 17 100 %  11/22/19 1845 111/77 -- (!) 109 (!) 26 100 %  11/22/19 1830 123/83 -- (!) 109 (!) 31 100 %  11/22/19 1815 122/80 98.5 F (36.9 C) (!) 109 (!) 26 99 %  11/22/19 1800 122/79 -- (!) 111 (!) 27 99 %  11/22/19 1745 139/81 -- (!) 105 19 99 %  11/22/19 1730 (!) 144/91 -- 100 (!) 22 99 %  11/22/19 1715 (!) 154/91 -- 95 (!) 24 100 %  11/22/19 1700 (!) 170/93 -- 97 19 100 %  11/22/19 1645 (!) 170/92 -- 97 20 100 %  11/22/19 1630 136/77 98.3 F (36.8 C) 95 15 100 %  11/22/19 1210 (!) 172/101 -- 92 15 100 %  11/22/19 1205 (!) 176/99 -- 95 20 100 %  11/22/19 1200 (!) 186/110 -- (!) 105 15 100 %  11/22/19 1155 (!) 190/104 -- 98 (!) 24 100 %  11/22/19 1150 (!) 197/102 -- 99 (!) 22 100 %   O2 sat on 2lit [place]  Intake/Output Summary (Last 24 hours) at 11/22/2019 2032 Last data filed at 11/22/2019 1830 Gross per 24 hour  Intake 2900 ml  Output 1825 ml  Net 1075 ml   CBC Latest Ref Rng & Units 11/22/2019 11/22/2019 11/15/2019  WBC 4.0 - 10.5 K/uL 19.2(H) - 6.1  Hemoglobin 12.0 - 15.0 g/dL 8.9(L) 14.6 13.6  Hematocrit 36.0 - 46.0 % 27.8(L) 43.0 43.1  Platelets 150 - 400 K/uL 340 - 389    BMP Latest Ref Rng & Units 11/22/2019 11/22/2019 11/15/2019  Glucose 70 - 99 mg/dL 239(H) 97 139(H)  BUN 6 - 20 mg/dL 15 17 16   Creatinine 0.44 - 1.00 mg/dL 0.77 0.80 0.83  Sodium 135 - 145 mmol/L 138 140 141  Potassium 3.5 - 5.1 mmol/L 3.7 3.9 4.2  Chloride 98 - 111 mmol/L 105  102 105  CO2 22 - 32 mmol/L 22 - 28  Calcium 8.9 - 10.3 mg/dL 8.1(L) - 9.4   Exam- Lungs CTA bilateral CV Tachycardia Abdo soft, appropriately tender in lower abdomen, no rebound/ guarding. Active BS Extr SCDs on  A/P: Post-op hypotension and tachycardia. Urine op adequate and Creatinine nl. Intra-op blood loss was about 800 cc, Hgb drop 4 points. Possible intra-abdominal bleeding vs hypovolemia from surgical blood loss.  Will transfer to Acute care unit- Telemetry 4 East and will continue to follow VS and I/O and assessments Transfuse 2 units pRBCs now. Order CT with contrast if no improvement in status  Pt informed of new finding and plan and husband informed by call.   Critical care time 1 hr  -V.Benjie Karvonen, MD

## 2019-11-22 NOTE — Op Note (Addendum)
PRE-OPERATIVE DIAGNOSIS:  Uterine Fibroids   POST-OPERATIVE DIAGNOSIS:  Multiple Uterine Fibroids with a very large subserosal myoma and 3 smaller myomas. Terminal ileum and appendiceal adhesions to fibroid  PROCEDURE:  Abdominal Myomectomy, enterolysis - Dr Sylvia Robinson and Dr Sylvia Robinson                            Repair of mesentric defect and appendicectomy by Dr Sylvia Robinson  SURGEON: Sylvia Royals, MD Co-Surgeon: Sylvia Few, MD  Consultant: General Surgeon, Dr Sylvia Robinson  ANESTHESIA:  General endotracheal and Pre-op TAP block   EBL: 800 cc  IVF: 2200 cc LR          500 cc Albumin  Urine output: urine in foley: 550 cc clear   BLOOD ADMINISTERED: None   DRAINS: Urinary Catheter (Foley)   LOCAL MEDICATIONS USED:  MARCAINE 0.25% 10 cc skin infiltration     SPECIMEN: Morcellated pieces of one large fibroid and 3 other small fibroids   DISPOSITION OF SPECIMEN:  PATHOLOGY  COUNTS:  YES  PATIENT DISPOSITION:  PACU - hemodynamically stable.    Delay start of Pharmacological VTE agent (>24hrs) due to surgical blood loss or risk of bleeding: yes  PROCEDURE:   Indication:  Symptomatic uterine fibroids with abdominal mass and discomfort. Uterine preservation was desired for future childbearing. Risks/ complications reviewed and patient gave informed written consent.  Patient received TAP block before moving back to the OR. Patient was brought to the operating room with IV running. She received 2 gm Ancef. Underwent general anesthesia without difficulty and was given dorsal supine position, prepped and draped in sterile fashion. Foley catheter was placed. Large abdominal mass reaching left ribs noted. Large pfannenstiel incision was made with scalpel and carried down to the underlying fascia with Bovie with excellent hemostasis.  Fascia incised and extended laterally. Fascia grasped with Kocher's and underlying rectus muscles were dissected down. Rectus muscles were separated in midline.  Posterior rectus sheath and posterior peritoneum was grasped with mosquitoes and peritoneal entry made.  Large fibroid uterus was palpated. Uterus was well defined with 3 subserosal myomas. The largest pedunculated myoma was filling the entire abdomen and was adherent to appendix and terminal ileum's mesentery on the right and was on a thick stalk arising from right fundal aspect with large blood vessels. Both tubes and ovaries were normal.  Penrose drain placed around the stalk of the large myoma and tightened. A small subserosal pedunculated myoma from left anterior aspect was grasped, vasopressin injected in the base and stalk was excised and myoma handed off. Bleeding cauterized and deep sutures on 0-Vicryl placed to cover myoma dead space, followed by serosa with 3-0 Vicryl. Now the large myoma (23x17 x20 cm) was injected with dilute vasopressin, especially the base of the stalk. Two Kelly clamps placed across the stalk and Myoma was excised, Interrupted deep figure of eight stitches placed on uterine incision for hemostasis and then serosal closed using 3-0 Vicryl. Pedunculated disconnected myoma was large and could not be delivered through the incision, so it was morcellated with knife and large pieces removed and collected for pathology. We didn't have any pieces fall in the abdominal cavity. After debulking significant amount, right side was reached where terminal ileum mesosalpinx and appendix were stuck. Enterolysis performed through clear windows and bowel loop freed from the fibroid. Appendix was freed in similar fashion. Bleeding from mesoappendix was clamped with Mosquito clamp.  General surgeon consult was requested to  assess bowel and mesentery and appendix. See Dr Sylvia Robinson note for details. But essentially, he repaired the tear in the mesentery and performed appendicectomy and ran bowel, no bowel injury was noted. Posterior peritoneal bleeding noted where mesentery was adherent to posterior  peritoneum, hemostasis achieved with a single stitch followed by Arista spray and then re approximating  the peritoneal tear to control bleeding. Dr Sylvia Robinson stepped out at this point.  We completed one more myomectomy from posterior right cornual area, sutured the myoma space for hemostasis and then serosa was sutured with 3-0 Vicryl. Peritoneal washing done. No active bleeding noted. Lap sponges removed. Arista sprayed on myomectomy suture lines and they were covered with single large piece of Interceed.  Abdomen was closed. Peritoneal edges grasped and peritoneum closed with 2-0 Vicryl. Fascia sutured with 0 Vicryl. Subcutaneous layer closed with 2-0 Plain gut. Skin approximated with 4-0 Vicryl in subcuticular fashion.  Steristrips and Honeycomb dressing placed. All  Counts were correct x2.. Patient was brought to PACU extubated and stable.  Surgical findings discussed with her husband.   --Sylvia Moat MD

## 2019-11-22 NOTE — H&P (Signed)
Sylvia Robinson is an 49 y.o. female G0. Here for abdominal myomectomy, Declines hysterectomy for future child bearing option. Patient presented to office for infertility intervention, didn't report abdominal mass until examination was performed and mass discussed. She denies dysmenorrhea/ menorrhagia/ dyspareunia/ abdominal pain or bladder or bowel changes. After discussing mass, she acknowledges clothes getting tighter, thought was weight gain. She didn't have any idea she has fibroids, she has not had an exam in a few yrs.  She was also diagnosed with DM and HTN at visit, she is aware of it but didn't follow up with doctor as she felt well.  She is married and desires pregnancy. Her spouse's semen analysis was very poor with azoospermia and she was advised of that. She was also counseled on low chances of spontaneous pregnancy at this age and AMA risks but she does not wish to discuss that further. She agreed to blood transfusion if needed.   Patient's last menstrual period was 11/10/2019 (exact date).   Past Medical History:  Diagnosis Date  . Anemia   . Diabetes mellitus without complication ([place])   . Hypertension     Past Surgical History:  Procedure Laterality Date  . WISDOM TOOTH EXTRACTION      History reviewed. No pertinent family history.  Social History:  reports that she has quit smoking. She has never used smokeless tobacco. She reports that she does not drink alcohol or use drugs.  Allergies: No Known Allergies  Medications Prior to Admission  Medication Sig Dispense Refill Last Dose  . amLODipine (NORVASC) 2.5 MG tablet Take 2.5 mg by mouth daily.   11/22/2019 at 0630  . Cholecalciferol (VITAMIN D3) 50 MCG (2000 UT) TABS Take 2,000 Units by mouth daily.   11/21/2019 at Unknown time  . FERREX 150 150 MG capsule Take 150 mg by mouth daily.   11/21/2019 at Unknown time  . ibuprofen (ADVIL) 200 MG tablet Take 200 mg by mouth every 6 (six) hours as needed for headache.    Past Month at Unknown time  . Insulin Glargine (BASAGLAR KWIKPEN) 100 UNIT/ML Inject 6-8 Units into the skin daily as needed.   11/21/2019 at Unknown time  . Prenatal Vit-Fe Fumarate-FA (PRENATAL MULTIVITAMIN) TABS tablet Take 1 tablet by mouth daily at 12 noon.   11/21/2019 at Unknown time  . TOUJEO SOLOSTAR 300 UNIT/ML Solostar Pen Inject 6-8 Units into the skin daily as needed.   11/21/2019 at Unknown time    Review of Systems  Blood pressure (!) 172/101, pulse 92, resp. rate 15, last menstrual period 11/10/2019, SpO2 100 %. Physical Exam A&O x 3, no acute distress. Pleasant HEENT neg, no thyromegaly Lungs CTA bilat CV RRR, S1S2 normal Abdo Large 26 cm- 28 cm mass of fibroids  Extr no edema/ tenderness Pelvic above    Results for orders placed or performed during the hospital encounter of 11/22/19 (from the past 24 hour(s))  Pregnancy, urine POC     Status: None   Collection Time: 11/22/19 10:41 AM  Result Value Ref Range   Preg Test, Ur NEGATIVE NEGATIVE  I-STAT, chem 8     Status: None   Collection Time: 11/22/19 11:49 AM  Result Value Ref Range   Sodium 140 135 - 145 mmol/L   Potassium 3.9 3.5 - 5.1 mmol/L   Chloride 102 98 - 111 mmol/L   BUN 17 6 - 20 mg/dL   Creatinine, Ser 0.80 0.44 - 1.00 mg/dL   Glucose, Bld 97 70 - 99  mg/dL   Calcium, Ion 1.26 1.15 - 1.40 mmol/L   TCO2 29 22 - 32 mmol/L   Hemoglobin 14.6 12.0 - 15.0 g/dL   HCT 43.0 36.0 - 46.0 %    FINDINGS: COMBINED FINDINGS FOR BOTH MR ABDOMEN AND PELVIS  Lower chest: No acute findings.  Hepatobiliary: No mass or other parenchymal abnormality identified.  Pancreas: No mass, inflammatory changes, or other parenchymal abnormality identified.  Spleen:  Within normal limits in size and appearance.  Adrenals/Urinary Tract:  Normal appearance of the adrenal glands.  Multiple cysts are identified within the left kidney. These measure up to 1.6 cm. On the subtraction images there is no  convincing evidence for enhancement within these lesions. Numerous smaller, less than 1 cm and too small to reliably characterize left kidney lesions are also noted.  Within the inferior pole of the right kidney there is a lesion arising from the medial cortex measuring 1.7 cm. This is mildly T2 hyperintense, T1 isointense. Without internal enhancement on the subtraction images.  Right-sided pelvocaliectasis is identified, which likely reflects mass effect from large uterine fibroids. Bladder is unremarkable.  Stomach/Bowel: Visualized portions within the abdomen are unremarkable.  Vascular/Lymphatic: No pathologically enlarged lymph nodes identified. No abdominal aortic aneurysm demonstrated.  Reproductive:  Uterus: Enlarged fibroid uterus measures 23.8 by 23.2 by 13.1 cm (volume = 3790 cm^3). Multiple fibroids are identified:  -dominant index fibroid arising from the right uterine fundus measures 23.3 x 17.1 by 13 cm. (Volume = 2700 cm^3) central area of degeneration with calcification is identified measuring 6.8 cm. Diffuse heterogeneous enhancement following IV contrast administration.  -partially exophytic subserosal fibroid arising from the right side of fundus measures 5.9 x 4.4 by 5.6 cm, image 15/2 and image 24/4. Enhances following contrast.  -subserosal fibroid arising from the right lateral wall of the uterus measures 5.7 by 5.6 by 5.3 cm. Enhances following IV contrast.  Endometrium: Normal trilaminar appearance of the endometrium. The large uterine fibroids has mass effect upon the endometrial cavity which is deviated towards the left side of pelvis. No sub mucosal abnormality identified.  Right ovary: Measures 3.2 x 1.6 by 1.9 cm. No adnexal mass identified.  Left ovary: Measures 4.0 by 2.1 by 2.9 cm. No adnexal mass identified.  Musculoskeletal: No suspicious bone lesions identified.  IMPRESSION: 1. Large fibroid uterus. This has a  volume of 3790 cc. There are 3 enhancing fibroids identified. The largest arises from the right-sided the fundus with a volume of 2700 cc. 2. Multiple kidney cysts are identified compatible with Bosniak category 1 and 2 lesions. No suspicious kidney lesions identified at this time. 3. Right-sided pelvocaliectasis secondary to mass effect from enlarged uterine fibroids.  Assessment/Plan: Large fibroid uterus. Here for abdominal myomectomy.  Risks/complications of surgery reviewed incl infection, bleeding, damage to internal organs including bladder, bowels, ureters, blood vessels, other risks from anesthesia, VTE and delayed complications of any surgery, complications in future surgery reviewed. Reviewed possible blood transfusion. Reviewed possible hysterectomy risk and she wants to make sure I do everything to avoid that, we agreed. Reviewed future new fibroids and needing another myomectomy or hysterectomy and she understands.  Informed written consent obtained.   Elveria Royals 11/22/2019, 12:53 PM

## 2019-11-22 NOTE — Anesthesia Procedure Notes (Signed)
Procedure Name: Intubation Date/Time: 11/22/2019 1:43 PM Performed by: Mechele Claude, CRNA Pre-anesthesia Checklist: Patient identified, Emergency Drugs available, Suction available and Patient being monitored Patient Re-evaluated:Patient Re-evaluated prior to induction Oxygen Delivery Method: Circle system utilized Preoxygenation: Pre-oxygenation with 100% oxygen Induction Type: IV induction Ventilation: Mask ventilation without difficulty Laryngoscope Size: Mac and 3 Grade View: Grade II Tube type: Oral Tube size: 7.5 mm Number of attempts: 1 Airway Equipment and Method: Stylet and Oral airway Placement Confirmation: ETT inserted through vocal cords under direct vision,  positive ETCO2 and breath sounds checked- equal and bilateral Secured at: 21 cm Tube secured with: Tape Dental Injury: Teeth and Oropharynx as per pre-operative assessment

## 2019-11-22 NOTE — Anesthesia Procedure Notes (Signed)
Anesthesia Regional Block: TAP block   Pre-Anesthetic Checklist: ,, timeout performed, Correct Patient, Correct Site, Correct Laterality, Correct Procedure, Correct Position, site marked, Risks and benefits discussed,  Surgical consent,  Pre-op evaluation,  At surgeon's request and post-op pain management  Laterality: Left and Right  Prep: chloraprep, alcohol swabs       Needles:  Injection technique: Single-shot  Needle Type: Echogenic Stimulator Needle     Needle Length: 9cm  Needle Gauge: 21     Additional Needles:   Narrative:  Start time: 11/22/2019 12:04 PM End time: 11/22/2019 12:14 PM Injection made incrementally with aspirations every 5 mL.  Performed by: Personally  Anesthesiologist: Lillia Abed, MD  Additional Notes: Monitors applied. Patient sedated. Sterile prep and drape,hand hygiene and sterile gloves were used.On both sides - relevant anatomy identified and needle position confirmed.Local anesthetic injected incrementally after negative aspiration. Local anesthetic spread visualized in Transversus Abdominus Plane bilaterally. Vascular puncture avoided. No complications. Images printed for medical record.The patient tolerated the procedure well.

## 2019-11-22 NOTE — Transfer of Care (Signed)
Immediate Anesthesia Transfer of Care Note  Patient: Aryiah Nuanez  Procedure(s) Performed: ABDOMINAL MYOMECTOMY, ENTEROLYSIS (N/A Abdomen) APPENDECTOMY AND REPAIR OF MESENTARY DEFECT (N/A Abdomen)  Patient Location: PACU  Anesthesia Type:General  Level of Consciousness: sedated  Airway & Oxygen Therapy: Patient Spontanous Breathing and Patient connected to face mask oxygen  Post-op Assessment: pt awake in OR, given Fentanyl prior to transport.  Apneic on arrival.  Easily ventilated with Ambu bag with O2Sat up to 100% rapidly.  OPA inserted for approx 5 minutes. BS now with OPA removed, awake and responding. Report given to RN  Post vital signs: Reviewed and stable  Last Vitals:  Vitals Value Taken Time  BP 151/85 11/22/19 1630  Temp    Pulse 102 11/22/19 1638  Resp 28 11/22/19 1638  SpO2 99 % 11/22/19 1638  Vitals shown include unvalidated device data.  Last Pain:  Vitals:   11/22/19 1630  PainSc: 0-No pain         Complications: No apparent anesthesia complications

## 2019-11-23 DIAGNOSIS — Y658 Other specified misadventures during surgical and medical care: Secondary | ICD-10-CM | POA: Diagnosis not present

## 2019-11-23 DIAGNOSIS — D252 Subserosal leiomyoma of uterus: Secondary | ICD-10-CM | POA: Diagnosis present

## 2019-11-23 DIAGNOSIS — D62 Acute posthemorrhagic anemia: Secondary | ICD-10-CM | POA: Diagnosis not present

## 2019-11-23 DIAGNOSIS — K660 Peritoneal adhesions (postprocedural) (postinfection): Secondary | ICD-10-CM | POA: Diagnosis present

## 2019-11-23 DIAGNOSIS — I10 Essential (primary) hypertension: Secondary | ICD-10-CM | POA: Diagnosis present

## 2019-11-23 DIAGNOSIS — E861 Hypovolemia: Secondary | ICD-10-CM | POA: Diagnosis not present

## 2019-11-23 DIAGNOSIS — E119 Type 2 diabetes mellitus without complications: Secondary | ICD-10-CM | POA: Diagnosis present

## 2019-11-23 DIAGNOSIS — Z794 Long term (current) use of insulin: Secondary | ICD-10-CM | POA: Diagnosis not present

## 2019-11-23 DIAGNOSIS — K9172 Accidental puncture and laceration of a digestive system organ or structure during other procedure: Secondary | ICD-10-CM | POA: Diagnosis not present

## 2019-11-23 DIAGNOSIS — N179 Acute kidney failure, unspecified: Secondary | ICD-10-CM | POA: Diagnosis not present

## 2019-11-23 DIAGNOSIS — I9581 Postprocedural hypotension: Secondary | ICD-10-CM | POA: Diagnosis not present

## 2019-11-23 DIAGNOSIS — Z87891 Personal history of nicotine dependence: Secondary | ICD-10-CM | POA: Diagnosis not present

## 2019-11-23 DIAGNOSIS — K388 Other specified diseases of appendix: Secondary | ICD-10-CM | POA: Diagnosis present

## 2019-11-23 LAB — CBC WITH DIFFERENTIAL/PLATELET
Abs Immature Granulocytes: 0.08 10*3/uL — ABNORMAL HIGH (ref 0.00–0.07)
Basophils Absolute: 0 10*3/uL (ref 0.0–0.1)
Basophils Relative: 0 %
Eosinophils Absolute: 0 10*3/uL (ref 0.0–0.5)
Eosinophils Relative: 0 %
HCT: 34.4 % — ABNORMAL LOW (ref 36.0–46.0)
Hemoglobin: 11.3 g/dL — ABNORMAL LOW (ref 12.0–15.0)
Immature Granulocytes: 1 %
Lymphocytes Relative: 8 %
Lymphs Abs: 1.4 10*3/uL (ref 0.7–4.0)
MCH: 27.8 pg (ref 26.0–34.0)
MCHC: 32.8 g/dL (ref 30.0–36.0)
MCV: 84.7 fL (ref 80.0–100.0)
Monocytes Absolute: 2.3 10*3/uL — ABNORMAL HIGH (ref 0.1–1.0)
Monocytes Relative: 13 %
Neutro Abs: 13.4 10*3/uL — ABNORMAL HIGH (ref 1.7–7.7)
Neutrophils Relative %: 78 %
Platelets: 222 10*3/uL (ref 150–400)
RBC: 4.06 MIL/uL (ref 3.87–5.11)
RDW: 16 % — ABNORMAL HIGH (ref 11.5–15.5)
WBC: 17.2 10*3/uL — ABNORMAL HIGH (ref 4.0–10.5)
nRBC: 0 % (ref 0.0–0.2)

## 2019-11-23 LAB — COMPREHENSIVE METABOLIC PANEL
ALT: 25 U/L (ref 0–44)
AST: 26 U/L (ref 15–41)
Albumin: 3.1 g/dL — ABNORMAL LOW (ref 3.5–5.0)
Alkaline Phosphatase: 36 U/L — ABNORMAL LOW (ref 38–126)
Anion gap: 11 (ref 5–15)
BUN: 28 mg/dL — ABNORMAL HIGH (ref 6–20)
CO2: 22 mmol/L (ref 22–32)
Calcium: 7.8 mg/dL — ABNORMAL LOW (ref 8.9–10.3)
Chloride: 103 mmol/L (ref 98–111)
Creatinine, Ser: 1.77 mg/dL — ABNORMAL HIGH (ref 0.44–1.00)
GFR calc Af Amer: 39 mL/min — ABNORMAL LOW (ref >60)
GFR calc non Af Amer: 34 mL/min — ABNORMAL LOW (ref >60)
Glucose, Bld: 239 mg/dL — ABNORMAL HIGH (ref 70–99)
Potassium: 4.2 mmol/L (ref 3.5–5.1)
Sodium: 136 mmol/L (ref 135–145)
Total Bilirubin: 0.9 mg/dL (ref 0.3–1.2)
Total Protein: 5.7 g/dL — ABNORMAL LOW (ref 6.5–8.1)

## 2019-11-23 LAB — GLUCOSE, CAPILLARY
Glucose-Capillary: 238 mg/dL — ABNORMAL HIGH (ref 70–99)
Glucose-Capillary: 287 mg/dL — ABNORMAL HIGH (ref 70–99)
Glucose-Capillary: 177 mg/dL — ABNORMAL HIGH (ref 70–99)
Glucose-Capillary: 74 mg/dL (ref 70–99)

## 2019-11-23 LAB — CBC
HCT: 28.9 % — ABNORMAL LOW (ref 36.0–46.0)
Hemoglobin: 9.7 g/dL — ABNORMAL LOW (ref 12.0–15.0)
MCH: 27.9 pg (ref 26.0–34.0)
MCHC: 33.6 g/dL (ref 30.0–36.0)
MCV: 83 fL (ref 80.0–100.0)
Platelets: 208 10*3/uL (ref 150–400)
RBC: 3.48 MIL/uL — ABNORMAL LOW (ref 3.87–5.11)
RDW: 16.4 % — ABNORMAL HIGH (ref 11.5–15.5)
WBC: 17.1 10*3/uL — ABNORMAL HIGH (ref 4.0–10.5)
nRBC: 0 % (ref 0.0–0.2)

## 2019-11-23 LAB — PROTIME-INR
INR: 1.1 (ref 0.8–1.2)
Prothrombin Time: 14.5 seconds (ref 11.4–15.2)

## 2019-11-23 LAB — LACTIC ACID, PLASMA: Lactic Acid, Venous: 3.1 mmol/L (ref 0.5–1.9)

## 2019-11-23 LAB — APTT: aPTT: 23 seconds — ABNORMAL LOW (ref 24–36)

## 2019-11-23 LAB — FIBRINOGEN: Fibrinogen: 358 mg/dL (ref 210–475)

## 2019-11-23 MED ORDER — CHLORHEXIDINE GLUCONATE CLOTH 2 % EX PADS: 6.0000 | MEDICATED_PAD | Freq: Every day | CUTANEOUS | Status: DC

## 2019-11-23 MED ORDER — SODIUM CHLORIDE 0.9 % IV SOLN
2.0000 g | INTRAVENOUS | Status: DC
  Filled 2019-11-23: qty 20

## 2019-11-23 MED ORDER — INSULIN ASPART 100 UNIT/ML ~~LOC~~ SOLN
0.0000 [IU] | Freq: Three times a day (TID) | SUBCUTANEOUS | Status: DC
  Administered 2019-11-23: 3 [IU] via SUBCUTANEOUS
  Administered 2019-11-23: 8 [IU] via SUBCUTANEOUS
  Administered 2019-11-24: 2 [IU] via SUBCUTANEOUS

## 2019-11-23 MED ORDER — METRONIDAZOLE IN NACL 5-0.79 MG/ML-% IV SOLN: 500.0000 mg | Freq: Three times a day (TID) | INTRAVENOUS | Status: DC

## 2019-11-23 MED ORDER — ACETAMINOPHEN 500 MG PO TABS
1000.0000 mg | ORAL_TABLET | Freq: Three times a day (TID) | ORAL | Status: DC
  Administered 2019-11-23 – 2019-11-24 (×5): 1000 mg via ORAL
  Filled 2019-11-23 (×5): qty 2

## 2019-11-23 MED ORDER — SODIUM CHLORIDE 0.9 % IV BOLUS (SEPSIS): 500.0000 mL | Freq: Once | INTRAVENOUS | Status: DC

## 2019-11-23 MED ORDER — SODIUM CHLORIDE 0.9 % IV BOLUS (SEPSIS): 1000.0000 mL | Freq: Once | INTRAVENOUS | Status: DC

## 2019-11-23 NOTE — Progress Notes (Signed)
Made provider aware of low blood pressure and pt critical lactic acid of 2.2 Will continue to monitor

## 2019-11-23 NOTE — Progress Notes (Signed)
Patient's foley catheter removed this morning.  Patient due to void. Patient able to void approximately 100 ml of urine and had a bowel movement.  Will continue to monitor.

## 2019-11-23 NOTE — Progress Notes (Signed)
Spoke with Dr Benjie Karvonen on the phone regarding this Code Sepsis. She agreed that this Code Sepsis could be cancelled. Requested formal discontinuation of order in Epic. Dr Benjie Karvonen agreed to work on this.

## 2019-11-23 NOTE — Progress Notes (Signed)
Just noted elevated repeat Lactic acid at 3.1. Though no obvious source of sepsis, possible morcellation of large degenerated myoma/ appy/ small bowel manipulation though no bowel injury and appendix was not inflamed. Hypotension/ tachy likely from acute blood loss vs post-op intra-abdominal bleed and hypovolemia, less likely sepsis but proceeding w sepsis protocol since repeat Lactic acid high.  RN informed. RN reports pt feeling better s/p 2 units pRBCs. Continue strict I/O and VS and will assess pt for more 2 more units of blood Will continue to monitor in Telemetry

## 2019-11-23 NOTE — Progress Notes (Signed)
Attempting to reach Dr Benjie Karvonen via secure chat about possible cancellation of Code Sepsis, as Dr Redmond Pulling from surgery recommends the cancellation and had cancelled blood cultures and lactic acid draws.

## 2019-11-23 NOTE — Progress Notes (Signed)
Patient ID: Sylvia Robinson, female   DOB: 17-Aug-1970, 49 y.o.   MRN: [id number]   Acute Care Surgery Service Progress Note:    Chief Complaint/Subjective: Had some transient hypoTN last night, tachycardia Got 2u prbc starting at around 4, finished just before 0700 Sepsis protocol initiated No n/v Sore, sore on sides if take deep breath  Objective: Vital signs in last 24 hours: Temp:  [97.6 F (36.4 C)-99.3 F (37.4 C)] 99.3 F (37.4 C) (04/14 0650) Pulse Rate:  [92-120] 111 (04/14 0650) Resp:  [15-31] 16 (04/14 0650) BP: (84-197)/(55-110) 117/81 (04/14 0650) SpO2:  [99 %-100 %] 100 % (04/14 0650) Weight:  [79.9 kg] 79.9 kg (04/14 0510)    Intake/Output from previous day: 04/13 0701 - 04/14 0700 In: 5706.3 [I.V.:4250; Blood:756.3; IV Piggyback:700] Out: 1875 [Urine:1075; Blood:800] Intake/Output this shift: No intake/output data recorded.  Lungs: cta, nonlabored  Cardiovascular: reg-mild tachy  Abd: soft, obese, not rigid, dressing c/d/i; expected TTP  Extremities: no edema, +SCDs  Neuro: alert, nonfocal, not ill appearing  Lab Results: CBC  Recent Labs    11/22/19 1908 11/22/19 2246  WBC 19.2* 20.2*  HGB 8.9* 7.6*  HCT 27.8* 23.8*  PLT 340 303   BMET Recent Labs    11/22/19 1149 11/22/19 1908  NA 140 138  K 3.9 3.7  CL 102 105  CO2  --  22  GLUCOSE 97 239*  BUN 17 15  CREATININE 0.80 0.77  CALCIUM  --  8.1*   LFT Hepatic Function Latest Ref Rng & Units 11/15/2019  Total Protein 6.5 - 8.1 g/dL 7.4  Albumin 3.5 - 5.0 g/dL 3.9  AST 15 - 41 U/L 36  ALT 0 - 44 U/L 106(H)  Alk Phosphatase 38 - 126 U/L 54  Total Bilirubin 0.3 - 1.2 mg/dL 0.5   PT/INR No results for input(s): LABPROT, INR in the last 72 hours. ABG No results for input(s): PHART, HCO3 in the last 72 hours.  Invalid input(s): PCO2, PO2  Studies/Results:  Anti-infectives: Anti-infectives (From admission, onward)   Start     Dose/Rate Route Frequency Ordered Stop   11/23/19 0800  cefTRIAXone (ROCEPHIN) 2 g in sodium chloride 0.9 % 100 mL IVPB     2 g 200 mL/hr over 30 Minutes Intravenous Every 24 hours 11/23/19 0602     11/23/19 0800  metroNIDAZOLE (FLAGYL) IVPB 500 mg     500 mg 100 mL/hr over 60 Minutes Intravenous Every 8 hours 11/23/19 0602     11/22/19 1130  ceFAZolin (ANCEF) IVPB 2g/100 mL premix     2 g 200 mL/hr over 30 Minutes Intravenous On call to O.R. 11/22/19 1118 11/22/19 1406      Medications: Scheduled Meds: . acetaminophen  1,000 mg Oral Q8H  . amLODipine  2.5 mg Oral Daily  . diphenhydrAMINE  25 mg Intravenous Once  . docusate sodium  100 mg Oral BID  . insulin aspart  0-15 Units Subcutaneous TID WC  . insulin glargine  6 Units Subcutaneous QHS   Continuous Infusions: . cefTRIAXone (ROCEPHIN)  IV    . lactated ringers    . metronidazole     PRN Meds:.HYDROmorphone (DILAUDID) injection, menthol-cetylpyridinium, ondansetron **OR** ondansetron (ZOFRAN) IV, oxyCODONE  Assessment/Plan: Patient Active Problem List   Diagnosis Date Noted  . Uterine fibroid 11/22/2019  . S/P myomectomy 11/22/2019   s/p Procedure(s): ABDOMINAL MYOMECTOMY, ENTEROLYSIS APPENDECTOMY AND REPAIR OF MESENTARY DEFECT 11/22/2019 DM 2 Acute blood loss anemia 13.6 (preop)-->8.9-->7.6-->2u prbc-->will be drawn at 0900  I dont think pt has sepsis. She had acute blood loss anemia with resultant transient hypoTN and tachycardia and didn't get blood until early this am. Believe wbc is stress response, etc.   Would rec cancelling sepsis protocol. I dc orders for fluid boluses, bld cultures, etc.   Will defer to dr mody about antibiotics - I don't think she needs prophylactic/empiric abx. There was no sign of bowel injury that I observed.   -VTE prophylaxis -Would hold chemical vte prophylaxis today given bleeding; scds only; OOB to chair later this am -Foley - defer to gyn -FEN - cmet to be drawn this am, start mIVF, start clears liquid -Endo- place on  SSI; lantus per gyn -H/o HTN - on home norvasc - may need to hold AM dose depending on BP - discussed with nurse -ABL anemia - f/u cbc this am.  Would prob repeat one later today at around 3pm. Would transfuse for hgb <7. (doesn't need hgb of 10 or higher)  Discussed my portion of procedure with pt.   Disposition:  LOS: 0 days    Leighton Ruff. Redmond Pulling, MD, FACS General, Bariatric, & Minimally Invasive Surgery [phone] [facility]

## 2019-11-23 NOTE — Progress Notes (Signed)
POD #1, Ex Laparotomy, myomectomy (one 23 x20 cm myoma and 3 smaller), enterolysis (densely adherent mesentery and mesoappendix) and Intra-op Gen Surg consut with Dr Redmond Pulling who performed appendicectomy and closure of terminal ileum mesenteric defect.  EBL at surgery 800 cc. Pt transferred to Telemetry last night following hypotension and tachycardia for close monitoring- presumed hypovolemia from blood loss vs post-op bleeding. S/p 2 units pRBCs overnight, done at 7 am, so AM labs pending.  This morning Sepsis protocol initiated after repeat Lactic acid elevated at 3.1   Subjective: Pt feeling better since 2 units pRBCs overnight, has more strength. No n/v. Only ice chips overnight. Reports being thirsty. Feels gas rumbling and has passed flatus. Bladder pressure and urgency are better. No CP/ SOB/ dizziness/ chills. No vag bleeding.   Objective: Vital signs in last 24 hours: Temp:  [97.6 F (36.4 C)-99.3 F (37.4 C)] 99.3 F (37.4 C) (04/14 0650) Pulse Rate:  [92-120] 111 (04/14 0650) Resp:  [15-31] 16 (04/14 0650) BP: (84-197)/(55-110) 117/81 (04/14 0650) SpO2:  [99 %-100 %] 100 % (04/14 0650) Weight:  [79.9 kg] 79.9 kg (04/14 0510) Weight change:    Patient Vitals for the past 24 hrs:  BP Temp Temp src Pulse Resp SpO2 Height Weight  11/23/19 0650 117/81 99.3 F (37.4 C) Oral (!) 111 16 100 % -- --  11/23/19 0510 109/73 98.4 F (36.9 C) Oral (!) 110 19 -- 5\' 6"  (1.676 m) 79.9 kg  11/23/19 0440 109/73 98.4 F (36.9 C) Oral (!) 110 20 100 % -- --  11/23/19 0425 106/69 98.4 F (36.9 C) Oral (!) 113 19 100 % -- --  11/23/19 0155 99/67 98.5 F (36.9 C) Oral (!) 112 19 100 % -- --  11/23/19 0130 97/68 97.6 F (36.4 C) Oral (!) 115 19 100 % -- --  11/23/19 0010 (!) 86/55 97.8 F (36.6 C) -- (!) 120 19 100 % -- --  11/22/19 2240 98/65 97.8 F (36.6 C) Oral (!) 114 19 100 % -- --  11/22/19 2117 112/72 98.2 F (36.8 C) Oral (!) 113 20 100 % -- --  11/22/19 2048 (!) 84/60 -- -- (!)  109 (!) 26 100 % -- --  11/22/19 2045 94/69 -- -- (!) 110 (!) 26 100 % -- --  11/22/19 2030 90/70 -- -- (!) 115 (!) 23 100 % -- --  11/22/19 2015 97/71 -- -- (!) 110 (!) 29 100 % -- --  11/22/19 2000 101/73 98.4 F (36.9 C) -- (!) 111 (!) 28 100 % -- --  11/22/19 1945 99/71 -- -- (!) 109 (!) 31 100 % -- --  11/22/19 1930 93/74 -- -- (!) 110 (!) 29 100 % -- --  11/22/19 1915 100/71 -- -- (!) 110 (!) 30 100 % -- --  11/22/19 1900 111/76 -- -- (!) 114 17 100 % -- --  11/22/19 1845 111/77 -- -- (!) 109 (!) 26 100 % -- --  11/22/19 1830 123/83 -- -- (!) 109 (!) 31 100 % -- --  11/22/19 1815 122/80 98.5 F (36.9 C) -- (!) 109 (!) 26 99 % -- --  11/22/19 1800 122/79 -- -- (!) 111 (!) 27 99 % -- --  11/22/19 1745 139/81 -- -- (!) 105 19 99 % -- --  11/22/19 1730 (!) 144/91 -- -- 100 (!) 22 99 % -- --  11/22/19 1715 (!) 154/91 -- -- 95 (!) 24 100 % -- --  11/22/19 1700 (!) 170/93 -- --  97 19 100 % -- --  11/22/19 1645 (!) 170/92 -- -- 97 20 100 % -- --  11/22/19 1630 136/77 98.3 F (36.8 C) -- 95 15 100 % -- --  11/22/19 1210 (!) 172/101 -- -- 92 15 100 % -- --  11/22/19 1205 (!) 176/99 -- -- 95 20 100 % -- --  11/22/19 1200 (!) 186/110 -- -- (!) 105 15 100 % -- --  11/22/19 1155 (!) 190/104 -- -- 98 (!) 24 100 % -- --  11/22/19 1150 (!) 197/102 -- -- 99 (!) 22 100 % -- --   Intake/Output from previous day: 04/13 0701 - 04/14 0700 In: 5706.3 [I.V.:4250; Blood:756.3; IV Piggyback:700] Out: 1875 [Urine:1075; Blood:800] Intake/Output this shift: No intake/output data recorded.  General appearance: alert and cooperative Resp: clear to auscultation bilaterally Cardio: regular rate and rhythm, S1, S2 normal, no murmur, click, rub or gallop GI: normal findings: bowel sounds normal, soft, non-tender and appropriately tender lower abdomen Extremities: Homans sign is negative, no sign of DVT Pulses: 2+ and symmetric Skin: Skin color, texture, turgor normal. No rashes or lesions or not  clammy Np vaginal bleeding   Lab Results:  CBC Latest Ref Rng & Units 11/22/2019 11/22/2019 11/22/2019  WBC 4.0 - 10.5 K/uL 20.2(H) 19.2(H) -  Hemoglobin 12.0 - 15.0 g/dL 7.6(L) 8.9(L) 14.6  Hematocrit 36.0 - 46.0 % 23.8(L) 27.8(L) 43.0  Platelets 150 - 400 K/uL 303 340 -   Dont have post-transfusion CBC yet  BMP Latest Ref Rng & Units 11/22/2019 11/22/2019 11/15/2019  Glucose 70 - 99 mg/dL 239(H) 97 139(H)  BUN 6 - 20 mg/dL 15 17 16   Creatinine 0.44 - 1.00 mg/dL 0.77 0.80 0.83  Sodium 135 - 145 mmol/L 138 140 141  Potassium 3.5 - 5.1 mmol/L 3.7 3.9 4.2  Chloride 98 - 111 mmol/L 105 102 105  CO2 22 - 32 mmol/L 22 - 28  Calcium 8.9 - 10.3 mg/dL 8.1(L) - 9.4   Lactic Acid, Venous    Component Value Date/Time   LATICACIDVEN 3.1 (HH) 11/23/2019 0108    Medications:  Scheduled: . acetaminophen  1,000 mg Oral Q8H  . amLODipine  2.5 mg Oral Daily  . Chlorhexidine Gluconate Cloth  6 each Topical Daily  . diphenhydrAMINE  25 mg Intravenous Once  . docusate sodium  100 mg Oral BID  . insulin aspart  0-15 Units Subcutaneous TID WC  . insulin glargine  6 Units Subcutaneous QHS    Current Facility-Administered Medications:  .  acetaminophen (TYLENOL) tablet 1,000 mg, 1,000 mg, Oral, Q8H, Greer Pickerel, MD .  amLODipine (NORVASC) tablet 2.5 mg, 2.5 mg, Oral, Daily, Sequan Auxier, MD .  cefTRIAXone (ROCEPHIN) 2 g in sodium chloride 0.9 % 100 mL IVPB, 2 g, Intravenous, Q24H, Mychael Soots, MD .  Chlorhexidine Gluconate Cloth 2 % PADS 6 each, 6 each, Topical, Daily, Zarielle Cea, MD .  diphenhydrAMINE (BENADRYL) injection 25 mg, 25 mg, Intravenous, Once, Azucena Fallen, MD, Stopped at 11/22/19 2012 .  docusate sodium (COLACE) capsule 100 mg, 100 mg, Oral, BID, Azucena Fallen, MD, Stopped at 11/22/19 2315 .  HYDROmorphone (DILAUDID) injection 0.5 mg, 0.5 mg, Intravenous, Q4H PRN, Azucena Fallen, MD .  insulin aspart (novoLOG) injection 0-15 Units, 0-15 Units, Subcutaneous, TID WC, Greer Pickerel, MD .  insulin glargine (LANTUS) injection 6 Units, 6 Units, Subcutaneous, QHS, Azucena Fallen, MD, Stopped at 11/22/19 2200 .  lactated ringers infusion, , Intravenous, Continuous, Tobey Lippard, Criss Alvine, MD, Last Rate: 100 mL/hr at 11/23/19  0825, New Bag at 11/23/19 0825 .  menthol-cetylpyridinium (CEPACOL) lozenge 3 mg, 1 lozenge, Oral, Q2H PRN, Azucena Fallen, MD .  metroNIDAZOLE (FLAGYL) IVPB 500 mg, 500 mg, Intravenous, Q8H, Garvey Westcott, MD .  ondansetron (ZOFRAN) tablet 4 mg, 4 mg, Oral, Q6H PRN **OR** ondansetron (ZOFRAN) injection 4 mg, 4 mg, Intravenous, Q6H PRN, Azucena Fallen, MD .  oxyCODONE (Oxy IR/ROXICODONE) immediate release tablet 5 mg, 5 mg, Oral, Q4H PRN, Azucena Fallen, MD   Assessment/Plan: POD #1.  On Tele for close monitoring of hypovolemia, improvement noted since blood transfusion, await labs, possible need to transfuse more blood discussed.  Suspect sepsis - Sepsis protocol initiated but no labs or meds have started yet. AM shift RN will initiate since blood transfusion done.  Advance diet to clear liquids if labs stable/ improve and no concern for re-exploration remain.  DM- restart Insulin and Metformin and continue to control. Will add sliding scale if not improved. Hyperglycemia c/w sepsis?  CHTN- low BPs now, Amlodipine on hold  Post-op pain mngmt discussed, Also start Incentive spirometry, dangle legs at bedside, keep foley for I/O until more information today.   Patient counseled and will inform husband by phone.    LOS: 0 days   Elveria Royals 11/23/2019, 8:20 AM

## 2019-11-23 NOTE — Progress Notes (Addendum)
Patient ID: Sherrilynn Roselle, female   DOB: Sep 07, 1970, 49 y.o.   MRN: [id number] Pt reassessed Feels well, was weak and dizzy when got up for bathroom, voiding well, flatus and BM +. Slight blood noted with BM. No N/V with clear diet.  BP 115/69 (BP Location: Right Arm)   Pulse (!) 106   Temp 98.9 F (37.2 C) (Oral)   Resp 17   Ht 5\' 6"  (1.676 m)   Wt 79.9 kg   LMP 11/10/2019 (Exact Date)   SpO2 97%   BMI 28.42 kg/m  BS better with insulin, Metformin Norvasc on hold  CBC Latest Ref Rng & Units 11/23/2019 11/23/2019 11/22/2019  WBC 4.0 - 10.5 K/uL 17.1(H) 17.2(H) 20.2(H)  Hemoglobin 12.0 - 15.0 g/dL 9.7(L) 11.3(L) 7.6(L)  Hematocrit 36.0 - 46.0 % 28.9(L) 34.4(L) 23.8(L)  Platelets 150 - 400 K/uL 208 222 303   Creatinine elevated, repeat tomorrow, likely from acute hypovolemia  I believe 11.3 was too soon after pRBCs and 9.7 represents true level after 2 units pRBCs.   Exam- A&O x 3 Lungs CTA CV mild tachy Abdo soft, NABS, no rebound/ guarding Extr no C/C/E   Stable, progressing as expected now. Transfer to regular Med/Surg, d/c cardiac monitoring, advance to gen diet   V.Ladarren Steiner,MD

## 2019-11-24 LAB — CBC
HCT: 24.2 % — ABNORMAL LOW (ref 36.0–46.0)
HCT: 26.4 % — ABNORMAL LOW (ref 36.0–46.0)
Hemoglobin: 8.2 g/dL — ABNORMAL LOW (ref 12.0–15.0)
Hemoglobin: 8.9 g/dL — ABNORMAL LOW (ref 12.0–15.0)
MCH: 28.5 pg (ref 26.0–34.0)
MCH: 29.1 pg (ref 26.0–34.0)
MCHC: 33.9 g/dL (ref 30.0–36.0)
MCHC: 33.7 g/dL (ref 30.0–36.0)
MCV: 84 fL (ref 80.0–100.0)
MCV: 86.3 fL (ref 80.0–100.0)
Platelets: 196 10*3/uL (ref 150–400)
Platelets: 209 10*3/uL (ref 150–400)
RBC: 2.88 MIL/uL — ABNORMAL LOW (ref 3.87–5.11)
RBC: 3.06 MIL/uL — ABNORMAL LOW (ref 3.87–5.11)
RDW: 16.5 % — ABNORMAL HIGH (ref 11.5–15.5)
RDW: 17 % — ABNORMAL HIGH (ref 11.5–15.5)
WBC: 15.8 10*3/uL — ABNORMAL HIGH (ref 4.0–10.5)
WBC: 17.4 10*3/uL — ABNORMAL HIGH (ref 4.0–10.5)
nRBC: 0 % (ref 0.0–0.2)
nRBC: 0 % (ref 0.0–0.2)

## 2019-11-24 LAB — TYPE AND SCREEN
ABO/RH(D): A POS
Antibody Screen: NEGATIVE
Unit division: 0
Unit division: 0

## 2019-11-24 LAB — BPAM RBC
Blood Product Expiration Date: 202105012359
Blood Product Expiration Date: 202105022359
ISSUE DATE / TIME: 202104140124
ISSUE DATE / TIME: 202104140417
Unit Type and Rh: 6200
Unit Type and Rh: 6200

## 2019-11-24 LAB — BASIC METABOLIC PANEL
Anion gap: 5 (ref 5–15)
BUN: 24 mg/dL — ABNORMAL HIGH (ref 6–20)
CO2: 28 mmol/L (ref 22–32)
Calcium: 8 mg/dL — ABNORMAL LOW (ref 8.9–10.3)
Chloride: 105 mmol/L (ref 98–111)
Creatinine, Ser: 1.22 mg/dL — ABNORMAL HIGH (ref 0.44–1.00)
GFR calc Af Amer: >60 mL/min (ref >60)
GFR calc non Af Amer: 53 mL/min — ABNORMAL LOW (ref >60)
Glucose, Bld: 145 mg/dL — ABNORMAL HIGH (ref 70–99)
Potassium: 3.8 mmol/L (ref 3.5–5.1)
Sodium: 138 mmol/L (ref 135–145)

## 2019-11-24 LAB — GLUCOSE, CAPILLARY
Glucose-Capillary: 103 mg/dL — ABNORMAL HIGH (ref 70–99)
Glucose-Capillary: 124 mg/dL — ABNORMAL HIGH (ref 70–99)
Glucose-Capillary: 139 mg/dL — ABNORMAL HIGH (ref 70–99)

## 2019-11-24 LAB — SURGICAL PATHOLOGY

## 2019-11-24 MED ORDER — OXYCODONE HCL 5 MG PO TABS: 5.0000 mg | ORAL_TABLET | Freq: Four times a day (QID) | ORAL | 0 refills | Status: AC | PRN

## 2019-11-24 MED ORDER — DOCUSATE SODIUM 100 MG PO CAPS: 100.0000 mg | ORAL_CAPSULE | Freq: Two times a day (BID) | ORAL | 0 refills | Status: DC

## 2019-11-24 MED ORDER — DIPHENHYDRAMINE HCL 25 MG PO CAPS
25.0000 mg | ORAL_CAPSULE | Freq: Once | ORAL | Status: AC
  Administered 2019-11-24: 25 mg via ORAL
  Filled 2019-11-24: qty 1

## 2019-11-24 MED ORDER — FERROUS SULFATE 325 (65 FE) MG PO TBEC: 325.0000 mg | DELAYED_RELEASE_TABLET | Freq: Every day | ORAL | 0 refills | Status: DC

## 2019-11-24 MED ORDER — SODIUM CHLORIDE 0.9 % IV SOLN
510.0000 mg | Freq: Once | INTRAVENOUS | Status: AC
  Administered 2019-11-24: 510 mg via INTRAVENOUS
  Filled 2019-11-24: qty 510

## 2019-11-24 MED ORDER — ACETAMINOPHEN 500 MG PO TABS: 1000.0000 mg | ORAL_TABLET | Freq: Three times a day (TID) | ORAL | 0 refills | Status: DC

## 2019-11-24 NOTE — Progress Notes (Signed)
Patient ID: Sylvia Robinson, female   DOB: Jun 19, 1971, 49 y.o.   MRN: [id number]   Acute Care Surgery Service Progress Note:    Chief Complaint/Subjective: No n/v Had BM Voiding ok - some blood in it per pt Walked in hall this am Diet going ok Pain ok - sore around incision  Objective: Vital signs in last 24 hours: Temp:  [98.4 F (36.9 C)-98.9 F (37.2 C)] 98.6 F (37 C) (04/15 0219) Pulse Rate:  [99-109] 109 (04/15 0219) Resp:  [17-32] 20 (04/15 0219) BP: (115-138)/(69-79) 136/76 (04/15 0219) SpO2:  [94 %-100 %] 94 % (04/15 0219) Last BM Date: 11/23/19  Intake/Output from previous day: 04/14 0701 - 04/15 0700 In: 2222.7 [P.O.:240; I.V.:1982.7] Out: 2600 [Urine:2600] Intake/Output this shift: No intake/output data recorded.  Lungs:  nonlabored  Cardiovascular: reg  Abd: soft, approp TTP, not really distended  Extremities: no edema, +SCDs  Neuro: alert, nonfocal  Lab Results: CBC  Recent Labs    11/23/19 1648 11/24/19 0509  WBC 17.1* 15.8*  HGB 9.7* 8.2*  HCT 28.9* 24.2*  PLT 208 196   BMET Recent Labs    11/23/19 0911 11/24/19 0509  NA 136 138  K 4.2 3.8  CL 103 105  CO2 22 28  GLUCOSE 239* 145*  BUN 28* 24*  CREATININE 1.77* 1.22*  CALCIUM 7.8* 8.0*   LFT Hepatic Function Latest Ref Rng & Units 11/23/2019 11/15/2019  Total Protein 6.5 - 8.1 g/dL 5.7(L) 7.4  Albumin 3.5 - 5.0 g/dL 3.1(L) 3.9  AST 15 - 41 U/L 26 36  ALT 0 - 44 U/L 25 106(H)  Alk Phosphatase 38 - 126 U/L 36(L) 54  Total Bilirubin 0.3 - 1.2 mg/dL 0.9 0.5   PT/INR Recent Labs    11/23/19 0911  LABPROT 14.5  INR 1.1   ABG No results for input(s): PHART, HCO3 in the last 72 hours.  Invalid input(s): PCO2, PO2  Studies/Results:  Anti-infectives: Anti-infectives (From admission, onward)   Start     Dose/Rate Route Frequency Ordered Stop   11/23/19 0800  cefTRIAXone (ROCEPHIN) 2 g in sodium chloride 0.9 % 100 mL IVPB  Status:  Discontinued     2 g 200 mL/hr over  30 Minutes Intravenous Every 24 hours 11/23/19 0602 11/23/19 1244   11/23/19 0800  metroNIDAZOLE (FLAGYL) IVPB 500 mg  Status:  Discontinued     500 mg 100 mL/hr over 60 Minutes Intravenous Every 8 hours 11/23/19 0602 11/23/19 1244   11/22/19 1130  ceFAZolin (ANCEF) IVPB 2g/100 mL premix     2 g 200 mL/hr over 30 Minutes Intravenous On call to O.R. 11/22/19 1118 11/22/19 1406      Medications: Scheduled Meds: . acetaminophen  1,000 mg Oral Q8H  . amLODipine  2.5 mg Oral Daily  . Chlorhexidine Gluconate Cloth  6 each Topical Daily  . diphenhydrAMINE  25 mg Intravenous Once  . docusate sodium  100 mg Oral BID  . insulin aspart  0-15 Units Subcutaneous TID WC  . insulin glargine  6 Units Subcutaneous QHS   Continuous Infusions: . ferumoxytol    . lactated ringers 100 mL/hr at 11/23/19 1755   PRN Meds:.menthol-cetylpyridinium, ondansetron **OR** ondansetron (ZOFRAN) IV, oxyCODONE  Assessment/Plan: Patient Active Problem List   Diagnosis Date Noted  . Uterine fibroid 11/22/2019  . S/P myomectomy 11/22/2019   s/p Procedure(s): ABDOMINAL MYOMECTOMY, ENTEROLYSIS APPENDECTOMY AND REPAIR OF MESENTARY DEFECT 11/22/2019 DM 2 Acute blood loss anemia 13.6 (preop)-->8.9-->7.6-->2u prbc-->11.3-->9.7-->8.2 (4/15) She has drifted back  down after transfusion probably ongoing mild bleeding Transfuse for hgb<7 She mentioned Dr Benjie Karvonen may dc today I would recommend checking another hgb this afternoon to ensure hgb is not continuing to fall if going to discharge. O/w repeat in am   -VTE prophylaxis -Would hold chemical vte prophylaxis today given bleeding; scds only; OOB -Acute kidney injury - Cr peaked at 1.77, Cr improving 1.2 -FEN -dec mivf, lytes ok, diet  -Endo- place on SSI; lantus per gyn -H/o HTN - on home norvasc - may need to hold AM dose depending on BP     LOS: 1 day    Kealy Lewter M. Redmond Pulling, MD, FACS General, Bariatric, & Minimally Invasive Surgery [phone] [facility]

## 2019-11-24 NOTE — Progress Notes (Signed)
POD # 2,  Abdominal myomectomy, Enterolysis (densely adherent mesentery and mesoappendix) and Intra-op Gen Surg consut with Dr Redmond Pulling who performed appendicectomy and closure of terminal ileum mesenteric defect.  EBL at surgery 800 cc. Still on Telemetry due to lack of rooms on Med/Surg   Subjective: Pt feeling better. Ambulated in hall without dizziness. Voiding, having BMs. NO n/v. Still on clears (was advanced last evening but didn't try solid food). Post op pain controlled with Tylenol, not requesting Oxycodone.  S/p 2 units pRBCs 4/14 AM.   Objective: Vital signs in last 24 hours: Temp:  [98.4 F (36.9 C)-98.9 F (37.2 C)] 98.6 F (37 C) (04/15 0219) Pulse Rate:  [99-109] 109 (04/15 0219) Resp:  [17-32] 20 (04/15 0219) BP: (115-138)/(69-79) 136/76 (04/15 0219) SpO2:  [94 %-100 %] 94 % (04/15 0219) Weight change:  Last BM Date: 11/23/19 Patient Vitals for the past 24 hrs:  BP Temp Temp src Pulse Resp SpO2  11/24/19 0219 136/76 98.6 F (37 C) Oral (!) 109 20 94 %  11/23/19 2113 138/77 98.4 F (36.9 C) Oral (!) 103 20 98 %  11/23/19 1847 136/76 98.5 F (36.9 C) Oral 99 20 100 %  11/23/19 1600 -- -- -- -- 17 --  11/23/19 1350 115/69 98.9 F (37.2 C) Oral (!) 106 (!) 30 97 %  11/23/19 1040 -- -- -- -- (!) 24 --  11/23/19 1016 118/79 98.8 F (37.1 C) Oral (!) 104 (!) 32 96 %   Intake/Output from previous day: 04/14 0701 - 04/15 0700 In: 2222.7 [P.O.:240; I.V.:1982.7] Out: 2600 [Urine:2600] Intake/Output this shift: No intake/output data recorded.  General appearance: alert and cooperative Resp: clear to auscultation bilaterally Cardio: regular rate and rhythm, S1, S2 normal, no murmur, click, rub or gallop GI: normal findings: bowel sounds normal, soft, non-tender and appropriately tender lower abdomen Extremities: Homans sign is negative, no sign of DVT Pulses: 2+ and symmetric Skin: Skin color, texture, turgor normal. No rashes or lesions or not clammy Np vaginal  bleeding   Lab Results:  CBC Latest Ref Rng & Units 11/24/2019 11/23/2019 11/23/2019  WBC 4.0 - 10.5 K/uL 15.8(H) 17.1(H) 17.2(H)  Hemoglobin 12.0 - 15.0 g/dL 8.2(L) 9.7(L) 11.3(L)  Hematocrit 36.0 - 46.0 % 24.2(L) 28.9(L) 34.4(L)  Platelets 150 - 400 K/uL 196 208 222   Dont have post-transfusion CBC yet  BMP Latest Ref Rng & Units 11/24/2019 11/23/2019 11/22/2019  Glucose 70 - 99 mg/dL 145(H) 239(H) 239(H)  BUN 6 - 20 mg/dL 24(H) 28(H) 15  Creatinine 0.44 - 1.00 mg/dL 1.22(H) 1.77(H) 0.77  Sodium 135 - 145 mmol/L 138 136 138  Potassium 3.5 - 5.1 mmol/L 3.8 4.2 3.7  Chloride 98 - 111 mmol/L 105 103 105  CO2 22 - 32 mmol/L 28 22 22   Calcium 8.9 - 10.3 mg/dL 8.0(L) 7.8(L) 8.1(L)   CBG (last 3)  Recent Labs    11/23/19 2120 11/24/19 0036 11/24/19 0801  GLUCAP 74 139* 124*   Medications:  Scheduled: . acetaminophen  1,000 mg Oral Q8H  . amLODipine  2.5 mg Oral Daily  . Chlorhexidine Gluconate Cloth  6 each Topical Daily  . diphenhydrAMINE  25 mg Intravenous Once  . docusate sodium  100 mg Oral BID  . insulin aspart  0-15 Units Subcutaneous TID WC  . insulin glargine  6 Units Subcutaneous QHS    Current Facility-Administered Medications:  .  acetaminophen (TYLENOL) tablet 1,000 mg, 1,000 mg, Oral, Q8H, Greer Pickerel, MD, 1,000 mg at 11/24/19 0535 .  amLODipine (NORVASC) tablet 2.5 mg, 2.5 mg, Oral, Daily, Michille Mcelrath, MD .  Chlorhexidine Gluconate Cloth 2 % PADS 6 each, 6 each, Topical, Daily, Emryn Flanery, MD .  diphenhydrAMINE (BENADRYL) injection 25 mg, 25 mg, Intravenous, Once, Azucena Fallen, MD, Stopped at 11/22/19 2012 .  docusate sodium (COLACE) capsule 100 mg, 100 mg, Oral, BID, Azucena Fallen, MD, 100 mg at 11/23/19 2128 .  ferumoxytol (FERAHEME) 510 mg in sodium chloride 0.9 % 100 mL IVPB, 510 mg, Intravenous, Once, Azucena Fallen, MD .  insulin aspart (novoLOG) injection 0-15 Units, 0-15 Units, Subcutaneous, TID WC, Greer Pickerel, MD, 3 Units at 11/23/19 1711 .   insulin glargine (LANTUS) injection 6 Units, 6 Units, Subcutaneous, QHS, Azucena Fallen, MD, Stopped at 11/22/19 2200 .  lactated ringers infusion, , Intravenous, Continuous, Rockland Kotarski, Criss Alvine, MD, Last Rate: 100 mL/hr at 11/23/19 1755, New Bag at 11/23/19 1755 .  menthol-cetylpyridinium (CEPACOL) lozenge 3 mg, 1 lozenge, Oral, Q2H PRN, Azucena Fallen, MD .  ondansetron (ZOFRAN) tablet 4 mg, 4 mg, Oral, Q6H PRN **OR** ondansetron (ZOFRAN) injection 4 mg, 4 mg, Intravenous, Q6H PRN, Azucena Fallen, MD .  oxyCODONE (Oxy IR/ROXICODONE) immediate release tablet 5 mg, 5 mg, Oral, Q4H PRN, Azucena Fallen, MD   Assessment/Plan: POD # 2 Stable and improved.  Advance diet to regular IV Feraheme x 1 dose.  Oxycodone PRN, o/w Tylenol for pain mngmt Hypovolemic renal changes- Creatinine improved,, will repeat BMP outpatient on 4/19 at post-op f/up DM- better, continue Insulin/ Metformin CHTN- BPs better, resume Amlodipine   If regular diet tolerated and IV Feraheme infused without complications, will discharge patient with f/up in office with me on 4/19.  Post-op care and warning signs discussed.   Sylvia Robinson 11/24/2019, 8:41 AM

## 2019-11-24 NOTE — Progress Notes (Signed)
Spoke with RN. Reports pt doing well, tolerated regular diet, ambulated well, tolerated Feraheme infusion well.  CBC now. If H/H stable, D/c home F/up with Dr Benjie Karvonen in office on 4/19 for wound check, dressing removal and BMP for creatinine

## 2019-11-24 NOTE — Progress Notes (Signed)
Went over discharge papers with patient and husband.  All questions answered.  AVS given. Pt wheeled out by NT.

## 2019-11-25 NOTE — Anesthesia Postprocedure Evaluation (Signed)
Anesthesia Post Note  Patient: Natale Melgarejo  Procedure(s) Performed: ABDOMINAL MYOMECTOMY, ENTEROLYSIS (N/A Abdomen) APPENDECTOMY AND REPAIR OF MESENTARY DEFECT (N/A Abdomen)     Patient location during evaluation: PACU Anesthesia Type: General Level of consciousness: awake and alert Pain management: pain level controlled Vital Signs Assessment: post-procedure vital signs reviewed and stable Respiratory status: spontaneous breathing, nonlabored ventilation, respiratory function stable and patient connected to nasal cannula oxygen Cardiovascular status: blood pressure returned to baseline and stable Postop Assessment: no apparent nausea or vomiting Anesthetic complications: no    Last Vitals:  Vitals:   11/24/19 0930 11/24/19 1200  BP: 136/72   Pulse:  (!) 106  Resp:    Temp:    SpO2:      Last Pain:  Vitals:   11/24/19 1422  TempSrc:   PainSc: 5                  Tiajuana Amass

## 2019-11-25 NOTE — Discharge Summary (Signed)
Physician Discharge Summary  Patient ID: Sylvia Robinson MRN: [id number] DOB/AGE: 47/30/1972 65 y.o.  Admit date: 11/22/2019   Discharge date: 11/24/2019  Admission Diagnoses: Large uterine fibroids. Chronic HTN, DM II   Discharge Diagnoses: Uterine fibroids. S/p Myomectomy, enterolysis (Dr Benjie Karvonen and Dr Ronita Hipps).                                         Appendicectomy and repair of ileum mesenteric tear (Dr Redmond Pulling)    Discharged Condition: good, improved.   Hospital Course:Abdominal myomectomy on [id number] complicated by large pedunculated myoma arising from right fundal area of uterus and filling up the entire abdomen. Right lateral adhesions to terminal ileum mesentery and mesoappendix and posterior peritoneum in the same area. Dr Greer Pickerel attended intra-op consult and performed appendicectomy and repaired mesenteric tear. EBL was 800 cc. She remained stable intra-op, received 500  cc Albumin besides LR. She came to PACU extubated. PACU RN called as BP kept getting lower and pulse higher around 3-4 hours post-op. Urine output was excellent then. Patient was not stable to stay in outpatient care and was transferred to Telemetry Unit for critical monitoring and 2 units packed RBCs transfused overnight. That improved BP, pulse. She was assessed for sepsis since based on vital signs Lactic acid was sent and was elevated but clinically it appeared to be hypovolemia with intra-op blood loss,, rather than sepsis and may be underestimated blood loss and possible some bleeding in myoma dissection space post-op.  After 2 units packed RBCs and improved status, she was advanced to liquids on POD#1 (4/14) and serial CBC noted to remain stable (initial rise was likely due to draw too soon after transfusion), but Creatinine rose to 1.7 consistent with acute renal changes from hypovolemia. On POD #1, she ambulated some, was able to void and tolerating clear diet, exam noted low but stable BPs and improved pulse  and active bowel sounds. Only mechanical DVT prevention was planned due to risk of worsening bleeding. On POD #2 (4/15) she appeared normal, had energy, was able to eat regular diet, ambulated, pain controlled with only Tylenol. CBC was stable on repeating same day before discharge and creatinine improved at 1.2 and will be repeat on 4/19 at office visit, deferring Ibuprofen. She was discharged with precautions and warning signs to report/  CHTN- Amlodipine was on hold but restarted on 11/24/19 DM II- Insulin restarted on 4/14 and Metformin restarted on 4/15-- improved BS.   Consults: Intra-op Dr Randall Hiss Wilson/ General Surgery  Significant Diagnostic Studies:  CBC Latest Ref Rng & Units 11/24/2019 11/24/2019 11/23/2019  WBC 4.0 - 10.5 K/uL 17.4(H) 15.8(H) 17.1(H)  Hemoglobin 12.0 - 15.0 g/dL 8.9(L) 8.2(L) 9.7(L)  Hematocrit 36.0 - 46.0 % 26.4(L) 24.2(L) 28.9(L)  Platelets 150 - 400 K/uL 209 196 208   BMP Latest Ref Rng & Units 11/24/2019 11/23/2019 11/22/2019  Glucose 70 - 99 mg/dL 145(H) 239(H) 239(H)  BUN 6 - 20 mg/dL 24(H) 28(H) 15  Creatinine 0.44 - 1.00 mg/dL 1.22(H) 1.77(H) 0.77  Sodium 135 - 145 mmol/L 138 136 138  Potassium 3.5 - 5.1 mmol/L 3.8 4.2 3.7  Chloride 98 - 111 mmol/L 105 103 105  CO2 22 - 32 mmol/L 28 22 22   Calcium 8.9 - 10.3 mg/dL 8.0(L) 7.8(L) 8.1(L)     Discharge Exam: Blood pressure 136/72, pulse (!) 106, temperature 98.6 F (37 C), temperature source  Oral, resp. rate 20, height 5\' 6"  (1.676 m), weight 79.9 kg, last menstrual period 11/10/2019, SpO2 94 %. General appearance: alert and cooperative Resp: clear to auscultation bilaterally Cardio: regular rate and rhythm, S1, S2 normal, no murmur, click, rub or gallop GI: soft, non-tender; bowel sounds normal; no masses,  no organomegaly Pelvic: no vaginal bleeding Extremities: extremities normal, atraumatic, no cyanosis or edema and Homans sign is negative, no sign of DVT Incision/Wound: Honeycomb mostly dry with 2 areas  of red spots   Disposition: Discharge disposition: 01-Home or Self Care       Discharge Instructions     Call MD for:   Complete by: As directed    Burning and pain with urination   Call MD for:  difficulty breathing, headache or visual disturbances   Complete by: As directed    Call MD for:  extreme fatigue   Complete by: As directed    Call MD for:  hives   Complete by: As directed    Call MD for:  persistant dizziness or light-headedness   Complete by: As directed    Call MD for:  persistant nausea and vomiting   Complete by: As directed    Call MD for:  redness, tenderness, or signs of infection (pain, swelling, redness, odor or green/yellow discharge around incision site)   Complete by: As directed    Call MD for:  severe uncontrolled pain   Complete by: As directed    Call MD for:  temperature >100.4   Complete by: As directed    Diet - low sodium heart healthy   Complete by: As directed    Discharge wound care:   Complete by: As directed    Ok to shower with this dressing   Driving Restrictions   Complete by: As directed    4 weeks   Increase activity slowly   Complete by: As directed    Lifting restrictions   Complete by: As directed    No more than 5 pounds for 4 weeks, then up to 10 pounds for next 2 weeks   Sexual Activity Restrictions   Complete by: As directed    4 weeks      Allergies as of 11/24/2019   No Known Allergies      Medication List     TAKE these medications    acetaminophen 500 MG tablet Commonly known as: TYLENOL Take 2 tablets (1,000 mg total) by mouth every 8 (eight) hours.   amLODipine 2.5 MG tablet Commonly known as: NORVASC Take 2.5 mg by mouth daily.   docusate sodium 100 MG capsule Commonly known as: COLACE Take 1 capsule (100 mg total) by mouth 2 (two) times daily.   Ferrex 150 150 MG capsule Generic drug: iron polysaccharides Take 150 mg by mouth daily.   ferrous sulfate 325 (65 FE) MG EC tablet Take 1  tablet (325 mg total) by mouth daily with breakfast.   ibuprofen 200 MG tablet Commonly known as: ADVIL Take 200 mg by mouth every 6 (six) hours as needed for headache.   metFORMIN 750 MG 24 hr tablet Commonly known as: GLUCOPHAGE-XR Take 750 mg by mouth daily.   oxyCODONE 5 MG immediate release tablet Commonly known as: Oxy IR/ROXICODONE Take 1 tablet (5 mg total) by mouth every 6 (six) hours as needed for up to 3 days for moderate pain.   prenatal multivitamin Tabs tablet Take 1 tablet by mouth daily at 12 noon.   Toujeo SoloStar 300 UNIT/ML  Solostar Pen Generic drug: insulin glargine (1 Unit Dial) Inject 6-8 Units into the skin daily as needed.   Vitamin D3 50 MCG (2000 UT) Tabs Take 2,000 Units by mouth daily.               Discharge Care Instructions  (From admission, onward)           Start     Ordered   11/24/19 0000  Discharge wound care:    Comments: Ok to shower with this dressing   11/24/19 1304            F/up 4/19 AM with Dr Benjie Karvonen for assessment, wound check, dressing removal, BMP  Signed: Elveria Royals 11/25/2019, 3:43 PM

## 2021-06-11 DIAGNOSIS — Z1329 Encounter for screening for other suspected endocrine disorder: Secondary | ICD-10-CM | POA: Diagnosis not present

## 2021-06-11 DIAGNOSIS — Z13220 Encounter for screening for lipoid disorders: Secondary | ICD-10-CM | POA: Diagnosis not present

## 2021-06-11 DIAGNOSIS — Z1231 Encounter for screening mammogram for malignant neoplasm of breast: Secondary | ICD-10-CM | POA: Diagnosis not present

## 2021-06-11 DIAGNOSIS — I10 Essential (primary) hypertension: Secondary | ICD-10-CM | POA: Diagnosis not present

## 2021-06-11 DIAGNOSIS — E119 Type 2 diabetes mellitus without complications: Secondary | ICD-10-CM | POA: Diagnosis not present

## 2021-06-11 DIAGNOSIS — Z0000 Encounter for general adult medical examination without abnormal findings: Secondary | ICD-10-CM | POA: Diagnosis not present

## 2021-06-11 DIAGNOSIS — Z6827 Body mass index (BMI) 27.0-27.9, adult: Secondary | ICD-10-CM | POA: Diagnosis not present

## 2021-06-11 DIAGNOSIS — Z01419 Encounter for gynecological examination (general) (routine) without abnormal findings: Secondary | ICD-10-CM | POA: Diagnosis not present

## 2021-06-30 IMAGING — MR MR ABDOMEN WO/W CM
17 series · 47 of 48 positions shown · IV contrast (16ml Multihance)
Comparison: None.

CT AP 09/09/2019

CLINICAL DATA: Evaluate uterine fibroids

EXAM:
MRI ABDOMEN AND PELVIS WITHOUT AND WITH CONTRAST
TECHNIQUE: Multiplanar multisequence MR imaging of the abdomen and pelvis was
performed both before and after the administration of intravenous
contrast.
CONTRAST:  16mL MULTIHANCE GADOBENATE DIMEGLUMINE 529 MG/ML IV SOLN

[Series 4: T2 · coronal · 5.0mm · 0.74mm/px · 2 of 19 slices shown (1 of 3)]
[im 1/19]
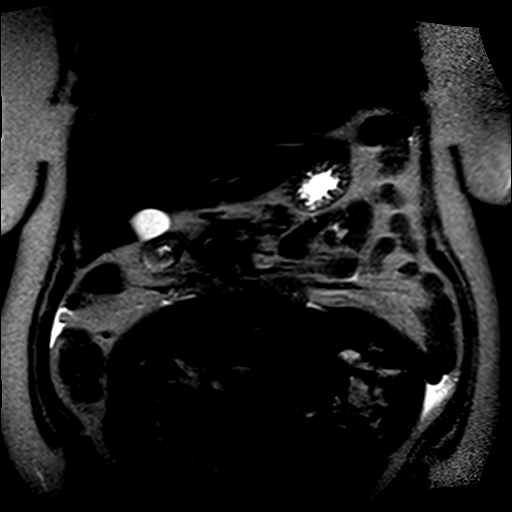
[im 19/19]
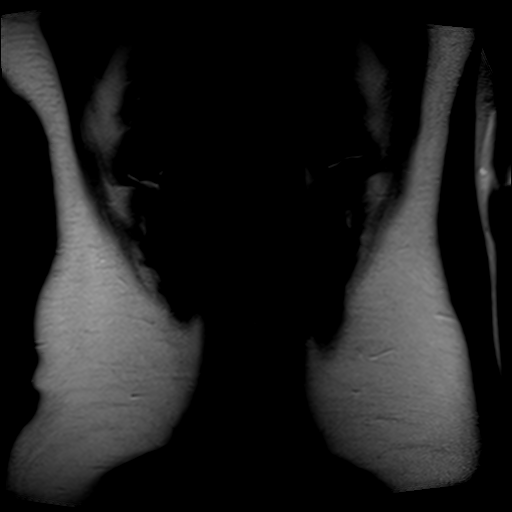

[Series 5: T2 · axial · 5.0mm · 0.68mm/px · z∈[+98,+254]mm · 2 of 27 slices shown (2 of 3)]
[im 1/27]
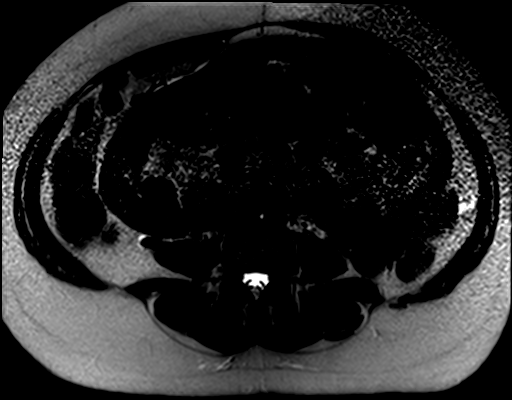
[im 27/27]
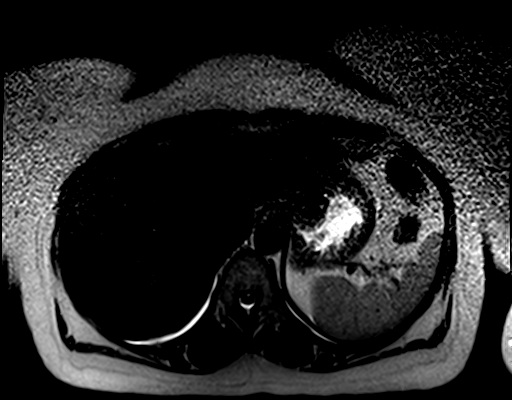

[Series 6: ep2d_diff_b50_500_800_p2_trig · axial · 5.0mm · 1.82mm/px · z∈[+95,+347]mm · 6 of 129 slices shown]
[im 1/129]
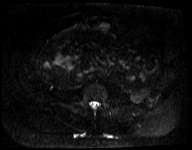
[im 26/129]
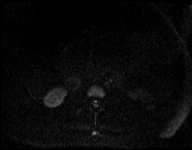
[im 52/129]
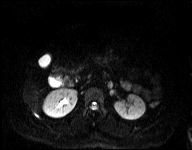
[im 77/129]
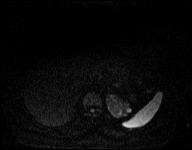
[im 103/129]
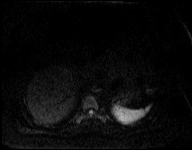
[im 129/129]
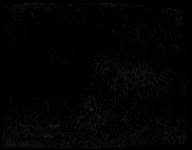

[Series 7: ep2d_diff_b50_500_800_p2_trig_adc · axial · 5.0mm · 1.82mm/px · z∈[+95,+347]mm · 2 of 43 slices shown]
[im 1/43]
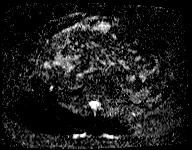
[im 43/43]
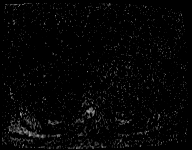

[Series 8: T2 · axial · 5.0mm · 1.37mm/px · z∈[+95,+347]mm · 2 of 43 slices shown (3 of 3)]
[im 1/43]
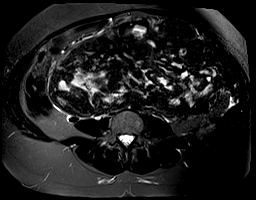
[im 43/43]
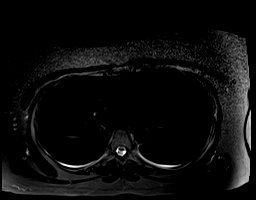

[Series 9: bSSFP · axial · 4.0mm · 0.68mm/px · z∈[+90,+262]mm · 2 of 44 slices shown]
[im 1/44]
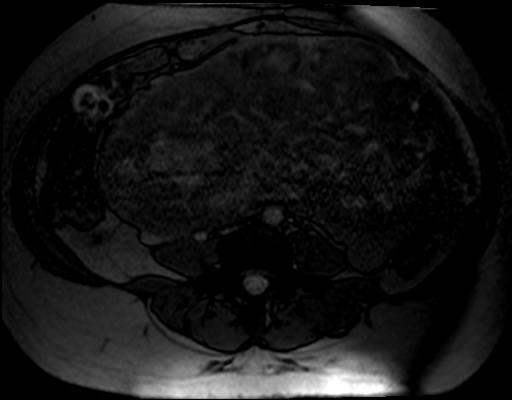
[im 44/44]
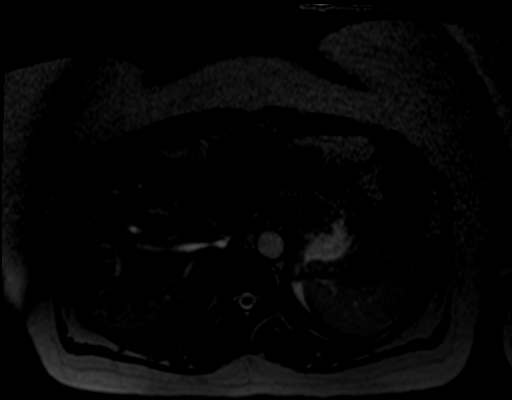

[Series 10: T1 · axial · 5.0mm · 0.68mm/px · z∈[+100,+321]mm · 3 of 76 slices shown]
[im 1/76]
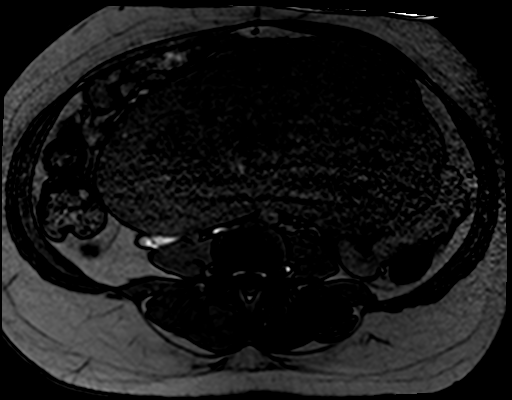
[im 38/76]
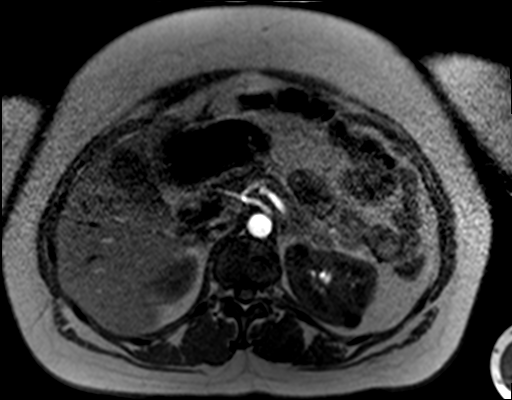
[im 76/76]
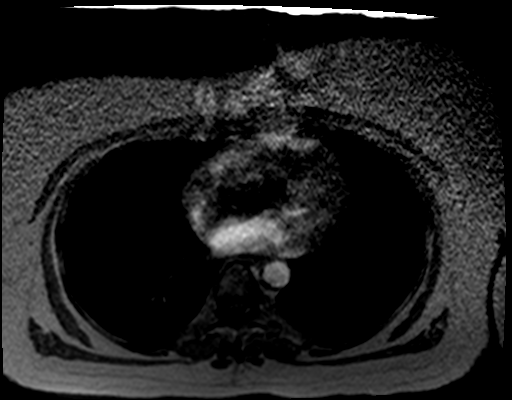

[Series 11: T1 dynamic · axial · non-contrast · 2.3mm · 1.37mm/px · z∈[+95,+258]mm · 3 of 72 slices shown]
[im 1/72]
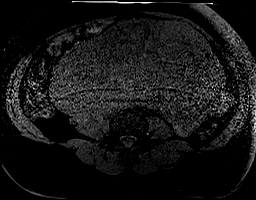
[im 36/72]
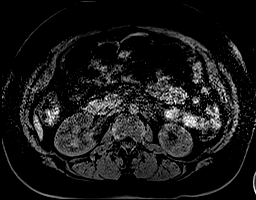
[im 72/72]
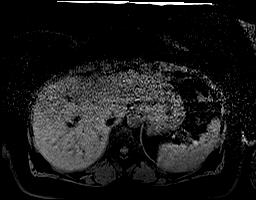

[Series 12: post 25 sec · axial · 2.3mm · 1.37mm/px · z∈[+95,+258]mm · 3 of 72 slices shown]
[im 1/72]
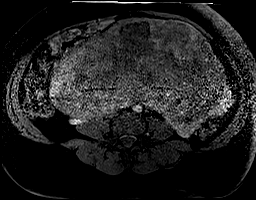
[im 36/72]
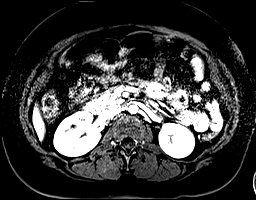
[im 72/72]
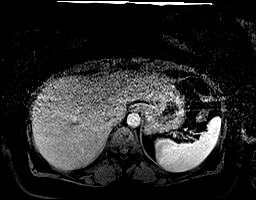

[Series 13: post 25 sec_sub · axial · 2.3mm · 1.37mm/px · z∈[+95,+258]mm · 3 of 72 slices shown]
[im 1/72]
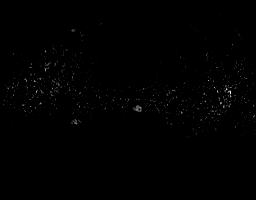
[im 36/72]
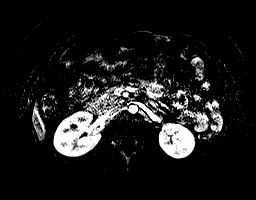
[im 72/72]
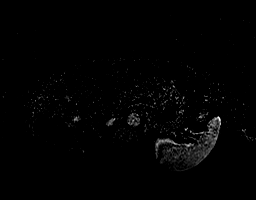

[Series 14: post 45 sec · axial · 2.3mm · 1.37mm/px · z∈[+95,+258]mm · 3 of 72 slices shown]
[im 1/72]
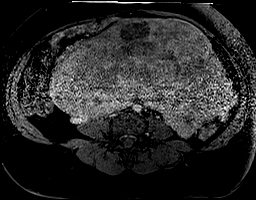
[im 36/72]
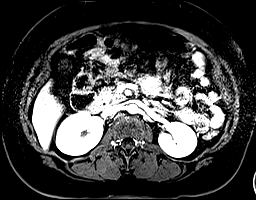
[im 72/72]
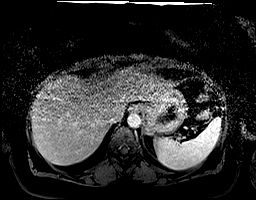

[Series 15: post 45 sec_sub · axial · 2.3mm · 1.37mm/px · z∈[+95,+258]mm · 3 of 72 slices shown]
[im 1/72]
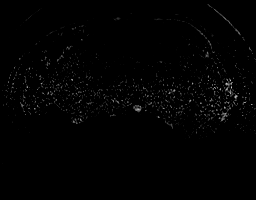
[im 36/72]
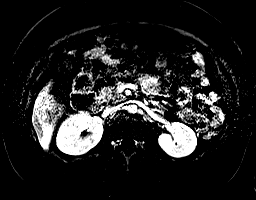
[im 72/72]
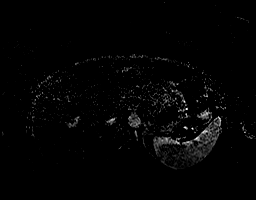

[Series 16: post 90 sec · axial · 2.3mm · 1.37mm/px · z∈[+95,+258]mm · 3 of 72 slices shown]
[im 1/72]
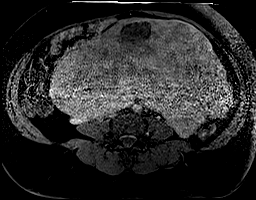
[im 36/72]
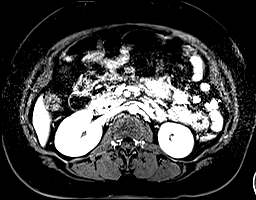
[im 72/72]
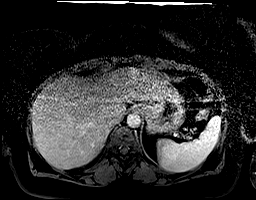

[Series 17: post 90 sec_sub · axial · 2.3mm · 1.37mm/px · z∈[+95,+258]mm · 3 of 72 slices shown]
[im 1/72]
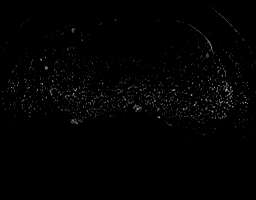
[im 36/72]
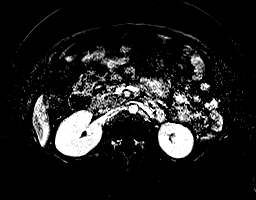
[im 72/72]
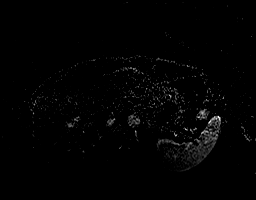

[Series 18: T1 dynamic post-contrast · coronal · 2.5mm · 0.74mm/px · 2 of 52 slices shown]
[im 1/52]
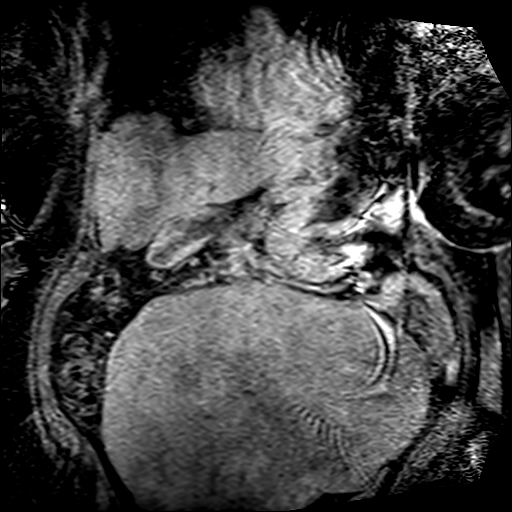
[im 52/52]
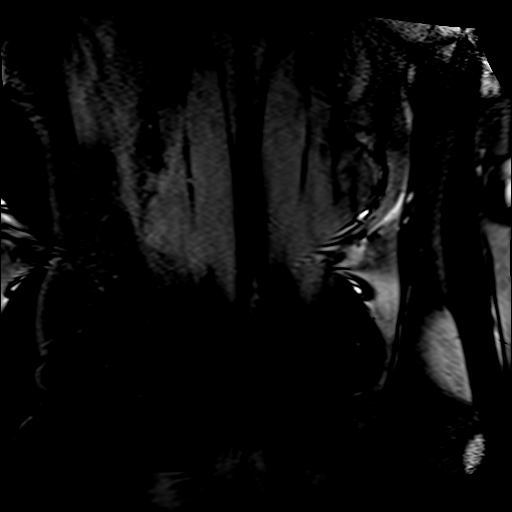

[Series 19: post axial 3+ · axial · 2.3mm · 1.37mm/px · z∈[+95,+258]mm · 3 of 72 slices shown (1 of 2)]
[im 1/72]
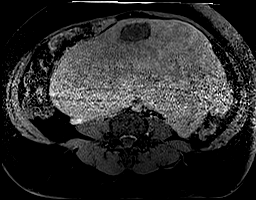
[im 36/72]
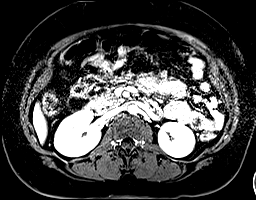
[im 72/72]
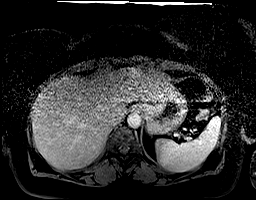

[Series 20: post axial 3+ · axial · 2.3mm · 1.37mm/px · z∈[+95,+175]mm · 2 of 72 slices shown (2 of 2)]
[im 1/72]
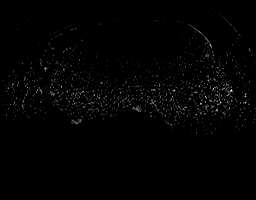
[im 36/72]
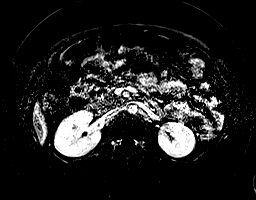

[47 of 48 positions shown; findings below may reference images not displayed]

FINDINGS: COMBINED FINDINGS FOR BOTH MR ABDOMEN AND PELVIS

Lower chest: No acute findings.

Hepatobiliary: No mass or other parenchymal abnormality identified.

Pancreas: No mass, inflammatory changes, or other parenchymal
abnormality identified.

Spleen:  Within normal limits in size and appearance.

Adrenals/Urinary Tract:  Normal appearance of the adrenal glands.

Multiple cysts are identified within the left kidney. These measure
up to 1.6 cm. On the subtraction images there is no convincing
evidence for enhancement within these lesions. Numerous smaller,
less than 1 cm and too small to reliably characterize left kidney
lesions are also noted.

Within the inferior pole of the right kidney there is a lesion
arising from the medial cortex measuring 1.7 cm. This is mildly T2
hyperintense, T1 isointense. Without internal enhancement on the
subtraction images.

Right-sided pelvocaliectasis is identified, which likely reflects
mass effect from large uterine fibroids. Bladder is unremarkable.

Stomach/Bowel: Visualized portions within the abdomen are
unremarkable.

Vascular/Lymphatic: No pathologically enlarged lymph nodes
identified. No abdominal aortic aneurysm demonstrated.

Reproductive:

Uterus: Enlarged fibroid uterus measures 23.8 by 23.2 by 13.1 cm
(volume = 8994 cm^3). Multiple fibroids are identified:

-dominant index fibroid arising from the right uterine fundus
measures 23.3 x 17.1 by 13 cm. (Volume = 6577 cm^3) central area
of degeneration with calcification is identified measuring 6.8 cm.
Diffuse heterogeneous enhancement following IV contrast
administration.

-partially exophytic subserosal fibroid arising from the right side
of fundus measures 5.9 x 4.4 by 5.6 cm, image [DATE] and image [DATE].
Enhances following contrast.

-subserosal fibroid arising from the right lateral wall of the
uterus measures 5.7 by 5.6 by 5.3 cm. Enhances following IV
contrast.

Endometrium: Normal trilaminar appearance of the endometrium. The
large uterine fibroids has mass effect upon the endometrial cavity
which is deviated towards the left side of pelvis. No sub mucosal
abnormality identified.

Right ovary: Measures 3.2 x 1.6 by 1.9 cm. No adnexal mass
identified.

Left ovary: Measures 4.0 by 2.1 by 2.9 cm. No adnexal mass
identified.

Musculoskeletal: No suspicious bone lesions identified.
IMPRESSION: 1. Large fibroid uterus. This has a volume of 8994 cc. There are 3
enhancing fibroids identified. The largest arises from the
right-sided the fundus with a volume of 6577 cc.
2. Multiple kidney cysts are identified compatible with Bosniak
category 1 and 2 lesions. No suspicious kidney lesions identified at
this time.
3. Right-sided pelvocaliectasis secondary to mass effect from
enlarged uterine fibroids.

## 2021-07-17 ENCOUNTER — Encounter (HOSPITAL_BASED_OUTPATIENT_CLINIC_OR_DEPARTMENT_OTHER): Payer: Self-pay | Admitting: Nurse Practitioner

## 2021-07-17 ENCOUNTER — Ambulatory Visit (INDEPENDENT_AMBULATORY_CARE_PROVIDER_SITE_OTHER): Payer: BC Managed Care – PPO | Admitting: Nurse Practitioner

## 2021-07-17 ENCOUNTER — Other Ambulatory Visit: Payer: Self-pay

## 2021-07-17 VITALS — BP 130/82 | HR 98 | Ht 66.0 in | Wt 173.5 lb

## 2021-07-17 DIAGNOSIS — N979 Female infertility, unspecified: Secondary | ICD-10-CM | POA: Insufficient documentation

## 2021-07-17 DIAGNOSIS — Z794 Long term (current) use of insulin: Secondary | ICD-10-CM

## 2021-07-17 DIAGNOSIS — E1165 Type 2 diabetes mellitus with hyperglycemia: Secondary | ICD-10-CM | POA: Diagnosis not present

## 2021-07-17 DIAGNOSIS — I10 Essential (primary) hypertension: Secondary | ICD-10-CM | POA: Diagnosis not present

## 2021-07-17 HISTORY — DX: Female infertility, unspecified: N97.9

## 2021-07-17 MED ORDER — TRULICITY 0.75 MG/0.5ML ~~LOC~~ SOAJ: 0.7500 mg | SUBCUTANEOUS | 0 refills | Status: DC

## 2021-07-17 MED ORDER — FREESTYLE LIBRE 3 SENSOR MISC: 1.0000 [IU] | 11 refills | Status: AC

## 2021-07-17 MED ORDER — TRULICITY 1.5 MG/0.5ML ~~LOC~~ SOAJ: 1.5000 mg | SUBCUTANEOUS | 1 refills | Status: DC

## 2021-07-17 NOTE — Progress Notes (Addendum)
Sylvia Ludwig, DNP, AGNP-c [facility] [address]  [place], [place] [address] [phone]  New patient visit   Patient: Sylvia Robinson   DOB: May 25, 1971   50 y.o. Female  MRN: [id number] Visit Date: 07/17/2021  Patient Care Team: Sylvia Robinson, Sylvia Pesa, NP as PCP - General (Nurse Practitioner)  Today's healthcare provider: Orma Render, NP   Chief Complaint  Patient presents with   New Patient (Initial Visit)    Patient is here to establish care. She concerned about her blood sugar and blood pressure. She is completely out of insulin and currently using her husbands. She had fibroid surgery last year. She is not interested in the flu shot.    Subjective    HPI Sylvia Robinson is a 50 y.o. female who presents today as a new patient to establish care.    Sylvia Robinson reports a history of HTN and DM for which she is requesting management.   DM She is currently taking metformin 750mg  extended release and toujeo 6-8 units as needed.  She tells me she is fairly consistent with her metformin dosing daily.  She is typically using the toujeo on a daily basis based on the amount of food eaten and how she feels. She will take this at the time of her largest meal. She has been on this medication for 5-6 years.   She is usually only checking her BG every other day and reports she rarely has low readings.  Her lowest recent reading has been 61 and this was overnight. She thinks she may have taken too much insulin when that happened.  She denies increased thirst or urination. She does report increased hunger and increased fatigue.  She tells me she is maintaining weight at that time and has not gained or lost any in the recent past.  She has no neuropathic symptoms, CP, palpitations, or vision changes.  Her most recent A1c was drawn by her [facility] in November and this was 8%   HTN She is currently taking amlodipine 2.5mg  and ASA 81mg   for this.  She has no symptoms of hypotension, dizziness, ShOB, CP, palpitations.  She does not check her BP at home.   She also has concerns about fertility.  Last year she underwent myomectomy for multiple fibroids. She reports that since the surgery her BM have regulated and she no longer has the abdominal girth she once did. She would like to conceive and she is working with her OB GYN for this with fertility discussions. It was recommended that she get her BG and BP under control prior to pregnancy for best outcomes.   Past Medical History:  Diagnosis Date   Anemia    Diabetes mellitus without complication ([place])    Hypertension    Type 2 diabetes mellitus without complication, with long-term current use of insulin ([place]) 03/01/2018   Past Surgical History:  Procedure Laterality Date   APPENDECTOMY N/A 11/22/2019   Procedure: APPENDECTOMY AND REPAIR OF MESENTARY DEFECT;  Surgeon: Azucena Fallen, MD;  Location: [facility];  Service: Gynecology;  Laterality: N/A;  DR Redmond Pulling 'S PROCEDURE   MYOMECTOMY N/A 11/22/2019   Procedure: ABDOMINAL MYOMECTOMY, ENTEROLYSIS;  Surgeon: Azucena Fallen, MD;  Location: [facility];  Service: Gynecology;  Laterality: N/A;  TAP BLOCK BY ANESTHESIOLOGIST PREOP   WISDOM TOOTH EXTRACTION     No family status information on file.   History reviewed. No  pertinent family history. Social History   Socioeconomic History   Marital status: Married    Spouse name: Not on file   Number of children: Not on file   Years of education: Not on file   Highest education level: Not on file  Occupational History   Not on file  Tobacco Use   Smoking status: Former   Smokeless tobacco: Never   Tobacco comments:    Few cigarettes here and there  Vaping Use   Vaping Use: Never used  Substance and Sexual Activity   Alcohol use: Never   Drug use: Never   Sexual activity: Yes  Other Topics Concern   Not on file  Social History Narrative    Not on file   Social Determinants of Health   Financial Resource Strain: Not on file  Food Insecurity: Not on file  Transportation Needs: Not on file  Physical Activity: Not on file  Stress: Not on file  Social Connections: Not on file   Outpatient Medications Prior to Visit  Medication Sig   amLODipine (NORVASC) 2.5 MG tablet Take 2.5 mg by mouth daily.   Cholecalciferol (VITAMIN D3) 50 MCG (2000 UT) TABS Take 2,000 Units by mouth daily.   metFORMIN (GLUCOPHAGE-XR) 750 MG 24 hr tablet Take 750 mg by mouth daily.   Prenatal Vit-Fe Fumarate-FA (PRENATAL MULTIVITAMIN) TABS tablet Take 1 tablet by mouth daily at 12 noon.   [DISCONTINUED] acetaminophen (TYLENOL) 500 MG tablet Take 2 tablets (1,000 mg total) by mouth every 8 (eight) hours.   [DISCONTINUED] docusate sodium (COLACE) 100 MG capsule Take 1 capsule (100 mg total) by mouth 2 (two) times daily.   [DISCONTINUED] FERREX 150 150 MG capsule Take 150 mg by mouth daily.   [DISCONTINUED] ferrous sulfate 325 (65 FE) MG EC tablet Take 1 tablet (325 mg total) by mouth daily with breakfast.   [DISCONTINUED] ibuprofen (ADVIL) 200 MG tablet Take 200 mg by mouth every 6 (six) hours as needed for headache.   [DISCONTINUED] TOUJEO SOLOSTAR 300 UNIT/ML Solostar Pen Inject 6-8 Units into the skin daily as needed.   No facility-administered medications prior to visit.   No Known Allergies   There is no immunization history on file for this patient.  Health Maintenance  Topic Date Due   FOOT EXAM  Never done   OPHTHALMOLOGY EXAM  Never done   URINE MICROALBUMIN  Never done   TETANUS/TDAP  Never done   PAP SMEAR-Modifier  Never done   COLONOSCOPY (Pts 45-55yrs Insurance coverage will need to be confirmed)  Never done   HEMOGLOBIN A1C  05/16/2020   COVID-19 Vaccine (1) 08/02/2021 (Originally 10/15/1971)   INFLUENZA VACCINE  11/08/2021 (Originally 03/11/2021)   Pneumococcal Vaccine 66-77 Years old (1 - PCV) 07/17/2022 (Originally 04/18/1979)    HPV VACCINES  Aged Out   Hepatitis C Screening  Discontinued   HIV Screening  Discontinued    Patient Care Team: Kye Silverstein, Sylvia Pesa, NP as PCP - General (Nurse Practitioner)  Review of Systems All review of systems negative except what is listed in the HPI   Objective    BP 130/82   Pulse 98   Ht 5\' 6"  (1.676 m)   Wt 173 lb 8 oz (78.7 kg)   SpO2 96%   BMI 28.00 kg/m  Physical Exam Vitals and nursing note reviewed.  Constitutional:      General: She is not in acute distress.    Appearance: Normal appearance.  Eyes:     Extraocular Movements:  Extraocular movements intact.     Conjunctiva/sclera: Conjunctivae normal.     Pupils: Pupils are equal, round, and reactive to light.  Neck:     Vascular: No carotid bruit.  Cardiovascular:     Rate and Rhythm: Normal rate and regular rhythm.     Pulses: Normal pulses.     Heart sounds: Normal heart sounds. No murmur heard. Pulmonary:     Effort: Pulmonary effort is normal.     Breath sounds: Normal breath sounds. No wheezing.  Abdominal:     General: Bowel sounds are normal.     Palpations: Abdomen is soft.  Musculoskeletal:        General: Normal range of motion.     Cervical back: Normal range of motion.     Right lower leg: No edema.     Left lower leg: No edema.  Skin:    General: Skin is warm and dry.     Capillary Refill: Capillary refill takes less than 2 seconds.  Neurological:     General: No focal deficit present.     Mental Status: She is alert and oriented to person, place, and time.  Psychiatric:        Mood and Affect: Mood normal.        Behavior: Behavior normal.        Thought Content: Thought content normal.        Judgment: Judgment normal.    Depression Screen PHQ 2/9 Scores 07/17/2021  PHQ - 2 Score 0   No results found for any visits on 07/17/21.  Assessment & Plan      Problem List Items Addressed This Visit     Essential hypertension    BP fairly well controlled today at 130/82.  Will  request recent labs from GYN for further evaluation.  At this time will send refill for amlodipine at 2.5mg .  Would like to get BP closer to 120/80 for optimal treatment. Today we will work to get improved control over blood sugars with addition of GLP-1 and continuous glucose monitoring in effort to help also lower BP.  Recommend continued use of aspirin daily.  F/U in 3 months or sooner if needed.        Type 2 diabetes mellitus with hyperglycemia, with long-term current use of insulin ([facility]) - Primary    New pt coming to me on metformin and toujeo daily injections.  Discussed current control and regulation of BG.  Unable to view recent labs, but report of most recent A1c at 8%, which is showing poor control at this time.  Discussed option of starting GLP-1 in order to work towards improved glucose control without need for insulin use. Discussed the CV benefits of this medication to help with BP and cardiac risks associated with chronic conditions as well as weight loss benefits seen.  Additional discussion on improved BG monitoring with use of CGM to help gain improved understanding of BG levels based on food choices.  Given patients hypoglycemic events and limited monitoring with finger sticks, I feel this is the safest option for management at this time. We can consider returning to finger sticks once glucose is under better control.  Patient agreeable to plan. Samples of Trulicity 0.75mg  and FreeStyle Libre3 provided to patient today to get started.  Instructions to continue metformin for at least the first 4 weeks. Will monitor for need with dose increase of Trulicity based on BG values from CGM.  F/U in 3 months for labs.  Relevant Medications   Dulaglutide (TRULICITY) 4.46 FJ/0.1QQ SOPN   Dulaglutide (TRULICITY) 1.5 UI/1.1OY SOPN   Continuous Blood Gluc Sensor (FREESTYLE LIBRE 3 SENSOR) MISC   Female fertility problem    Fertility desired with previous surgical myomectomy  procedure and advanced maternal age.  Patient reports multiple blood tests for fertility levels.  At this time, we will work on improvement of BG and HTN to help improve possible pregnancy outcomes.  Discussed with patient if pregnancy is achieved, GLP-1 medication will need to be stopped and treatment changed to insulin for the time of pregnancy. She expressed understanding.  Will review labs from GYN for evaluation and make recommendations based on findings.  F/U in 3 months.         Return in about 3 months (around 10/15/2021) for DM, HTN, Check A1c.      Amarien Carne, Sylvia Pesa, NP, DNP, AGNP-C [facility] at [place]

## 2021-07-17 NOTE — Assessment & Plan Note (Signed)
New pt coming to me on metformin and toujeo daily injections.  Discussed current control and regulation of BG.  Unable to view recent labs, but report of most recent A1c at 8%, which is showing poor control at this time.  Discussed option of starting GLP-1 in order to work towards improved glucose control without need for insulin use. Discussed the CV benefits of this medication to help with BP and cardiac risks associated with chronic conditions as well as weight loss benefits seen.  Additional discussion on improved BG monitoring with use of CGM to help gain improved understanding of BG levels based on food choices.  Given patients hypoglycemic events and limited monitoring with finger sticks, I feel this is the safest option for management at this time. We can consider returning to finger sticks once glucose is under better control.  Patient agreeable to plan. Samples of Trulicity 0.75mg  and FreeStyle Libre3 provided to patient today to get started.  Instructions to continue metformin for at least the first 4 weeks. Will monitor for need with dose increase of Trulicity based on BG values from CGM.  F/U in 3 months for labs.

## 2021-07-17 NOTE — Patient Instructions (Addendum)
Thank you for choosing [facility] for your Primary Care needs. I am excited for the opportunity to partner with you to meet your health care goals. It was a pleasure meeting you today!  Recommendations from today's visit: We are starting a new medication called Trulicity. This is a GLP-1 medication used once a week as an injection. I have given you a one month supply and a coupon for your second month.  If this medication is not covered well by your insurance, let me know and we will see if we can get more coupons or samples for you. We may also be able to find a different medication.  Keep taking the metformin for 4 weeks then stop when you go up to the 1.71m dose.  Stop using insulin.  Eat smaller meals.  I have sent the Freestyle meter prescription to the pharmacy. Let me know if this is expensive or not covered well with insurance.  I want to follow-up with you in 3 months to check your A1c and BP to see how you are doing.   Information on diet, exercise, and health maintenance recommendations are listed below. This is information to help you be sure you are on track for optimal health and monitoring.   Please look over this and let [facility] if you have any questions or if you have completed any of the health maintenance outside of [facility] that we can be sure your records are up to date.  ___________________________________________________________ About Me: I am an Adult-Geriatric Nurse Practitioner with a background in caring for patients for more than 20 years with a strong intensive care background. I provide primary care and sports medicine services to patients age 1623and older within this office. My education had a strong focus on caring for the older adult population, which I am passionate about. I am also the director of the APP [facility]   My desire is to provide you with the best service through preventive medicine and  supportive care. I consider you a part of the medical team and value your input. I work diligently to ensure that you are heard and your needs are met in a safe and effective manner. I want you to feel comfortable with me as your provider and want you to know that your health concerns are important to me.  For your information, our office hours are: Monday, Tuesday, and Thursday 8:00 AM - 5:00 PM Wednesday and Friday 8:00 AM - 12:00 PM.   In my time away from the office I am teaching new APP's within the system and am unavailable, but my partner, Dr. dBurnard Buntingis in the office for emergent needs.   If you have questions or concerns, please call our office at [phone] send [facility] MyChart message and we will respond as quickly as possible.  ____________________________________________________________ MyChart:  For all urgent or time sensitive needs we ask that you please call the office to avoid delays. Our number is [phone]. MyChart is not constantly monitored and due to the large volume of messages a day, replies may take up to 72 business hours.  MyChart Policy: MyChart allows for you to see your visit notes, after visit summary, provider recommendations, lab and tests results, make an appointment, request refills, and contact your provider or the office for non-urgent questions or concerns. Providers are seeing patients during normal business hours and do not have built in time to review  MyChart messages.  We ask that you allow a minimum of 3 business days for responses to [employer]. For this reason, please do not send urgent requests through [place]. Please call the office at [phone]. New and ongoing conditions may require a visit. We have virtual and in person visit available for your convenience.  Complex MyChart concerns may require a visit. Your provider may request you schedule a virtual or in person visit to ensure we are providing the best care possible. MyChart  messages sent after 11:00 AM on Friday will not be received by the provider until Monday morning.    Lab and Test Results: You will receive your lab and test results on MyChart as soon as they are completed and results have been sent by the lab or testing facility. Due to this service, you will receive your results BEFORE your provider.  I review lab and tests results each morning prior to seeing patients. Some results require collaboration with other providers to ensure you are receiving the most appropriate care. For this reason, we ask that you please allow a minimum of 3-5 business days from the time the ALL results have been received for your provider to receive and review lab and test results and contact you about these.  Most lab and test result comments from the provider will be sent through [place]. Your provider may recommend changes to the plan of care, follow-up visits, repeat testing, ask questions, or request an office visit to discuss these results. You may reply directly to this message or call the office at [phone] to provide information for the provider or set up an appointment. In some instances, you will be called with test results and recommendations. Please let us know if this is preferred and we will make note of this in your chart to provide this for you.    If you have not heard a response to your lab or test results in 5 business days from all results returning to [place], please call the office to let us know. We ask that you please avoid calling prior to this time unless there is an emergent concern. Due to high call volumes, this can delay the resulting process.  After Hours: For all non-emergency after hours needs, please call the office at [phone] and select the option to reach the on-call provider service. On-call services are shared between multiple [place] offices and therefore it will not be possible to speak directly with your provider. On-call providers  may provide medical advice and recommendations, but are unable to provide refills for maintenance medications.  For all emergency or urgent medical needs after normal business hours, we recommend that you seek care at the closest Urgent Care or Emergency Department to ensure appropriate treatment in a timely manner.  [facility] [place] has a 24 hour emergency room located on the ground floor for your convenience.   Urgent Concerns During the Business Day Providers are seeing patients from 8AM to [place] with a busy schedule and are most often not able to respond to non-urgent calls until the end of the day or the next business day. If you should have URGENT concerns during the day, please call and speak to the nurse or schedule a same day appointment so that we can address your concern without delay.   Thank you, again, for choosing me as your health care partner. I appreciate your trust and look forward to learning more about you.   [name], DNP,  AGNP-c ___________________________________________________________  Health Maintenance Recommendations Screening Testing Mammogram Every 1 -2 years based on history and risk factors Starting at age 2 Pap Smear Ages 21-39 every 3 years Ages 63-65 every 5 years with HPV testing More frequent testing may be required based on results and history Colon Cancer Screening Every 1-10 years based on test performed, risk factors, and history Starting at age 10 Bone Density Screening Every 2-10 years based on history Starting at age 42 for women Recommendations for men differ based on medication usage, history, and risk factors AAA Screening One time ultrasound Men 57-73 years old who have every smoked Lung Cancer Screening Low Dose Lung CT every 12 months Age 14-80 years with a 30 pack-year smoking history who still smoke or who have quit within the last 15 years  Screening Labs Routine  Labs: Complete Blood Count (CBC),  Complete Metabolic Panel (CMP), Cholesterol (Lipid Panel) Every 6-12 months based on history and medications May be recommended more frequently based on current conditions or previous results Hemoglobin A1c Lab Every 3-12 months based on history and previous results Starting at age 51 or earlier with diagnosis of diabetes, high cholesterol, BMI >26, and/or risk factors Frequent monitoring for patients with diabetes to ensure blood sugar control Thyroid Panel (TSH w/ T3 & T4) Every 6 months based on history, symptoms, and risk factors May be repeated more often if on medication HIV One time testing for all patients 53 and older May be repeated more frequently for patients with increased risk factors or exposure Hepatitis C One time testing for all patients 25 and older May be repeated more frequently for patients with increased risk factors or exposure Gonorrhea, Chlamydia Every 12 months for all sexually active persons 13-24 years Additional monitoring may be recommended for those who are considered high risk or who have symptoms PSA Men 43-44 years old with risk factors Additional screening may be recommended from age 77-69 based on risk factors, symptoms, and history  Vaccine Recommendations Tetanus Booster All adults every 10 years Flu Vaccine All patients 6 months and older every year COVID Vaccine All patients 12 years and older Initial dosing with booster May recommend additional booster based on age and health history HPV Vaccine 2 doses all patients age 43-26 Dosing may be considered for patients over 26 Shingles Vaccine (Shingrix) 2 doses all adults 66 years and older Pneumonia (Pneumovax 23) All adults 22 years and older May recommend earlier dosing based on health history Pneumonia (Prevnar 53) All adults 78 years and older Dosed 1 year after Pneumovax 23  Additional Screening, Testing, and Vaccinations may be recommended on an individualized basis based on  family history, health history, risk factors, and/or exposure.  __________________________________________________________  Diet Recommendations for All Patients  I recommend that all patients maintain a diet low in saturated fats, carbohydrates, and cholesterol. While this can be challenging at first, it is not impossible and small changes can make big differences.  Things to try: Decreasing the amount of soda, sweet tea, and/or juice to one or less per day and replace with water While water is always the first choice, if you do not like water you may consider adding a water additive without sugar to improve the taste other sugar free drinks Replace potatoes with a brightly colored vegetable at dinner Use healthy oils, such as canola oil or olive oil, instead of butter or hard margarine Limit your bread intake to two pieces or less a day Replace regular pasta with low  carb pasta options Bake, broil, or grill foods instead of frying Monitor portion sizes  Eat smaller, more frequent meals throughout the day instead of large meals  An important thing to remember is, if you love foods that are not great for your health, you don't have to give them up completely. Instead, allow these foods to be a reward when you have done well. Allowing yourself to still have special treats every once in a while is a nice way to tell yourself thank you for working hard to keep yourself healthy.   Also remember that every day is a new day. If you have a bad day and "fall off the wagon", you can still climb right back up and keep moving along on your journey!  We have resources available to help you!  Some websites that may be helpful include: www.http://carter.biz/  Www.VeryWellFit.com _____________________________________________________________  Activity Recommendations for All Patients  I recommend that all adults get at least 20 minutes of moderate physical activity that elevates your heart rate at least 5  days out of the week.  Some examples include: Walking or jogging at a pace that allows you to carry on a conversation Cycling (stationary bike or outdoors) Water aerobics Yoga Weight lifting Dancing If physical limitations prevent you from putting stress on your joints, exercise in a pool or seated in a chair are excellent options.  Do determine your MAXIMUM heart rate for activity: YOUR AGE - 220 = MAX HeartRate   Remember! Do not push yourself too hard.  Start slowly and build up your pace, speed, weight, time in exercise, etc.  Allow your body to rest between exercise and get good sleep. You will need more water than normal when you are exerting yourself. Do not wait until you are thirsty to drink. Drink with a purpose of getting in at least 8, 8 ounce glasses of water a day plus more depending on how much you exercise and sweat.    If you begin to develop dizziness, chest pain, abdominal pain, jaw pain, shortness of breath, headache, vision changes, lightheadedness, or other concerning symptoms, stop the activity and allow your body to rest. If your symptoms are severe, seek emergency evaluation immediately. If your symptoms are concerning, but not severe, please let us know so that we can recommend further evaluation.

## 2021-07-17 NOTE — Assessment & Plan Note (Signed)
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  Patient reports multiple blood tests for fertility levels.  At this time, we will work on improvement of BG and HTN to help improve possible pregnancy outcomes.  Discussed with patient if pregnancy is achieved, GLP-1 medication will need to be stopped and treatment changed to insulin for the time of pregnancy. She expressed understanding.  Will review labs from GYN for evaluation and make recommendations based on findings.  F/U in 3 months.

## 2021-07-17 NOTE — Assessment & Plan Note (Signed)
BP fairly well controlled today at 130/82.  Will request recent labs from GYN for further evaluation.  At this time will send refill for amlodipine at 2.5mg .  Would like to get BP closer to 120/80 for optimal treatment. Today we will work to get improved control over blood sugars with addition of GLP-1 and continuous glucose monitoring in effort to help also lower BP.  Recommend continued use of aspirin daily.  F/U in 3 months or sooner if needed.

## 2021-07-25 ENCOUNTER — Encounter (HOSPITAL_BASED_OUTPATIENT_CLINIC_OR_DEPARTMENT_OTHER): Payer: Self-pay | Admitting: Nurse Practitioner

## 2021-08-18 ENCOUNTER — Encounter (HOSPITAL_BASED_OUTPATIENT_CLINIC_OR_DEPARTMENT_OTHER): Payer: Self-pay | Admitting: Nurse Practitioner

## 2021-08-30 ENCOUNTER — Encounter (HOSPITAL_BASED_OUTPATIENT_CLINIC_OR_DEPARTMENT_OTHER): Payer: Self-pay | Admitting: Nurse Practitioner

## 2021-09-03 ENCOUNTER — Encounter (HOSPITAL_BASED_OUTPATIENT_CLINIC_OR_DEPARTMENT_OTHER): Payer: Self-pay | Admitting: Nurse Practitioner

## 2021-09-03 DIAGNOSIS — N979 Female infertility, unspecified: Secondary | ICD-10-CM

## 2021-09-05 ENCOUNTER — Other Ambulatory Visit (HOSPITAL_BASED_OUTPATIENT_CLINIC_OR_DEPARTMENT_OTHER): Payer: Self-pay

## 2021-09-05 DIAGNOSIS — E1165 Type 2 diabetes mellitus with hyperglycemia: Secondary | ICD-10-CM

## 2021-09-05 MED ORDER — TRULICITY 1.5 MG/0.5ML ~~LOC~~ SOAJ: 1.5000 mg | SUBCUTANEOUS | 2 refills | Status: DC

## 2021-09-08 ENCOUNTER — Encounter (HOSPITAL_BASED_OUTPATIENT_CLINIC_OR_DEPARTMENT_OTHER): Payer: Self-pay | Admitting: Nurse Practitioner

## 2021-10-15 ENCOUNTER — Ambulatory Visit (INDEPENDENT_AMBULATORY_CARE_PROVIDER_SITE_OTHER): Payer: BC Managed Care – PPO | Admitting: Nurse Practitioner

## 2021-10-15 ENCOUNTER — Encounter (HOSPITAL_BASED_OUTPATIENT_CLINIC_OR_DEPARTMENT_OTHER): Payer: Self-pay | Admitting: Nurse Practitioner

## 2021-10-15 ENCOUNTER — Other Ambulatory Visit: Payer: Self-pay

## 2021-10-15 ENCOUNTER — Other Ambulatory Visit (HOSPITAL_BASED_OUTPATIENT_CLINIC_OR_DEPARTMENT_OTHER): Payer: Self-pay

## 2021-10-15 VITALS — BP 122/82 | HR 94 | Ht 66.0 in | Wt 165.2 lb

## 2021-10-15 DIAGNOSIS — Z794 Long term (current) use of insulin: Secondary | ICD-10-CM | POA: Diagnosis not present

## 2021-10-15 DIAGNOSIS — E1165 Type 2 diabetes mellitus with hyperglycemia: Secondary | ICD-10-CM | POA: Diagnosis not present

## 2021-10-15 DIAGNOSIS — D75839 Thrombocytosis, unspecified: Secondary | ICD-10-CM

## 2021-10-15 DIAGNOSIS — I10 Essential (primary) hypertension: Secondary | ICD-10-CM

## 2021-10-15 LAB — CBC WITH DIFF/PLATELET
Basophils Absolute: 0 10*3/uL (ref 0.0–0.2)
Basos: 1 %
EOS (ABSOLUTE): 0.1 10*3/uL (ref 0.0–0.4)
Eos: 2 %
Hematocrit: 37.6 % (ref 34.0–46.6)
Hemoglobin: 12.4 g/dL (ref 11.1–15.9)
Immature Grans (Abs): 0 10*3/uL (ref 0.0–0.1)
Immature Granulocytes: 0 %
Lymphocytes Absolute: 2.3 10*3/uL (ref 0.7–3.1)
Lymphs: 31 %
MCH: 28.4 pg (ref 26.6–33.0)
MCHC: 33 g/dL (ref 31.5–35.7)
MCV: 86 fL (ref 79–97)
Monocytes Absolute: 0.6 10*3/uL (ref 0.1–0.9)
Monocytes: 8 %
Neutrophils Absolute: 4.3 10*3/uL (ref 1.4–7.0)
Neutrophils: 58 %
Platelets: 479 10*3/uL — ABNORMAL HIGH (ref 150–450)
RBC: 4.37 x10E6/uL (ref 3.77–5.28)
RDW: 13.1 % (ref 11.7–15.4)
WBC: 7.4 10*3/uL (ref 3.4–10.8)

## 2021-10-15 LAB — HEMOGLOBIN A1C
Est. average glucose Bld gHb Est-mCnc: 146 mg/dL
Hgb A1c MFr Bld: 6.7 % — ABNORMAL HIGH (ref 4.8–5.6)

## 2021-10-15 MED ORDER — TRULICITY 3 MG/0.5ML ~~LOC~~ SOAJ
3.0000 mg | SUBCUTANEOUS | 5 refills | Status: DC
  Filled 2021-10-15 – 2022-04-21 (×3): qty 2, 28d supply, fill #0

## 2021-10-15 NOTE — Progress Notes (Addendum)
Established Patient Office Visit  Subjective:  Patient ID: Sylvia Robinson, female    DOB: October 30, 1970  Age: 51 y.o. MRN: [id number]  CC:  Chief Complaint  Patient presents with   Follow-up    Patient is feeling great and glucose is under control. Patient has lost some weight, she is concerned that she is feeling more hungry.     HPI Sylvia Robinson presents for follow-up for Diabetes.  DIABETES Current Medications: Trulicity Medication Side Effects: none Taking medications as prescribed:  Yes  Glucose Monitoring: Yes   Frequency:  CGM  Average Fasting BG: 118  Average BG Overall: 143  Libreview Review: Highs occurring in the evening hours frequently Blood Pressure Monitoring:  not checking  Hypoglycemic episodes:no Polydipsia/polyuria: no Visual changes: no Chest pain: no Paresthesias: no Diabetic Diet: yes Exercise: yes  Retinal Examination: Due Now Foot Exam: Due Today Urine Microalbumin: Due Today Pneumovax: Up to Date Influenza: Up to Date COVID:Up to Date  HYPERTENSION Hypertension status: controlled  Satisfied with current treatment? yes Duration of hypertension: chronic BP monitoring frequency:  not checking BP medication side effects:  no Medication compliance: excellent compliance Aspirin: no Recurrent headaches: no Visual changes: no Palpitations: no Dyspnea: no Chest pain: no Lower extremity edema: no Dizzy/lightheaded: no  Last A1c: Current A1c:  Lab Results  Component Value Date   HGBA1C 7.1 (H) 10/16/2022   Weight:  Wt Readings from Last 3 Encounters:  10/16/22 161 lb 9.6 oz (73.3 kg)  06/19/22 165 lb (74.8 kg)  10/15/21 165 lb 3.2 oz (74.9 kg)   BMI: Estimated body mass index is 26.66 kg/m as calculated from the following:   Height as of this encounter: '5\' 6"'$  (1.676 m).   Weight as of this encounter: 165 lb 3.2 oz (74.9 kg). 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     Continuous Blood Gluc Sensor (FREESTYLE LIBRE 3 SENSOR) MISC 1 Units by Does not apply route every 14 (fourteen) days. Place 1 sensor on the skin every 14 days. Use to check glucose continuously (Patient not taking: Reported on 10/16/2022) 2 each 11   Prenatal Vit-Fe Fumarate-FA (PRENATAL MULTIVITAMIN) TABS tablet Take 1 tablet by mouth daily at 12 noon.     amLODipine (NORVASC) 2.5 MG tablet Take 2.5 mg by mouth daily.     Dulaglutide (TRULICITY) A999333 0000000 SOPN Inject 0.75 mg into the skin once a week. 2 mL 0   Dulaglutide (TRULICITY) 1.5 0000000 SOPN Inject 1.5 mg into the skin once a week. Start after completing the sample pens. 2 mL 2   metFORMIN (GLUCOPHAGE-XR) 750 MG 24 hr tablet Take 750 mg by mouth daily.     No facility-administered medications prior to visit.    No Known Allergies  ROS Review of Systems All review of systems negative except what is listed in the HPI    Objective:    Physical Exam Vitals and nursing note reviewed.  Constitutional:      Appearance:  Normal appearance.  HENT:     Head: Normocephalic.  Eyes:     Extraocular Movements: Extraocular movements intact.     Conjunctiva/sclera: Conjunctivae normal.     Pupils: Pupils are equal, round, and reactive to light.  Neck:     Vascular: No carotid bruit.  Cardiovascular:     Rate and Rhythm: Normal rate and regular rhythm.     Pulses: Normal pulses.     Heart sounds: Normal heart sounds.  Pulmonary:     Effort: Pulmonary effort is normal.     Breath sounds: Normal breath  sounds.  Musculoskeletal:     Cervical back: Normal range of motion.     Right lower leg: No edema.     Left lower leg: No edema.  Skin:    General: Skin is warm and dry.     Capillary Refill: Capillary refill takes less than 2 seconds.  Neurological:     General: No focal deficit present.     Mental Status: She is alert and oriented to person, place, and time.  Psychiatric:        Mood and Affect: Mood normal.        Behavior: Behavior normal.        Thought Content: Thought content normal.        Judgment: Judgment normal.     BP 122/82   Pulse 94   Ht '5\' 6"'$  (1.676 m)   Wt 165 lb 3.2 oz (74.9 kg)   SpO2 99%   BMI 26.66 kg/m  Wt Readings from Last 3 Encounters:  10/16/22 161 lb 9.6 oz (73.3 kg)  06/19/22 165 lb (74.8 kg)  10/15/21 165 lb 3.2 oz (74.9 kg)     Health Maintenance Due  Topic Date Due   OPHTHALMOLOGY EXAM  Never done   DTaP/Tdap/Td (1 - Tdap) Never done   PAP SMEAR-Modifier  Never done    There are no preventive care reminders to display for this patient.  Lab Results  Component Value Date   TSH 1.660 06/19/2022   Lab Results  Component Value Date   WBC 6.9 10/16/2022   HGB 10.1 (L) 10/16/2022   HCT 32.0 (L) 10/16/2022   MCV 78 (L) 10/16/2022   PLT 509 (H) 10/16/2022   Lab Results  Component Value Date   NA 140 10/16/2022   K 4.3 10/16/2022   CO2 24 10/16/2022   GLUCOSE 104 (H) 10/16/2022   BUN 12 10/16/2022   CREATININE 0.95 10/16/2022   BILITOT <0.2 10/16/2022   ALKPHOS 90 10/16/2022   AST 14 10/16/2022   ALT 7 10/16/2022   PROT 7.3 10/16/2022   ALBUMIN 4.2 10/16/2022   CALCIUM 9.3 10/16/2022   ANIONGAP 5 11/24/2019   EGFR 73 10/16/2022   Lab Results  Component Value Date   CHOL 217 (H) 10/16/2022   Lab Results  Component Value Date   HDL 60 10/16/2022   Lab Results  Component Value Date   LDLCALC 144 (H) 10/16/2022   Lab Results  Component Value Date   TRIG 73 10/16/2022   Lab Results  Component Value Date    CHOLHDL 3.6 10/16/2022   Lab Results  Component Value Date   HGBA1C 7.1 (H) 10/16/2022      Assessment & Plan:   Problem List Items Addressed This Visit     Essential hypertension - Primary    Chronic. BP well controlled today in office. No alarm sx present.  Continue current medication regimen.  Not  currently on statin therapy- encourage this.  Labs today. F/U in 3 months      Relevant Orders   Ambulatory referral to Ophthalmology   Hemoglobin A1c (Completed)   HM DIABETES FOOT EXAM (Completed)   CBC With Diff/Platelet (Completed)   Type 2 diabetes mellitus with hyperglycemia (HCC)    Chronic. Improved control with Trulicity 1.'5mg'$ .  She has lost about 10 lbs which is fantastic.  She is having no symptoms.  Review if CGM today with LibreView to evaluate control and patterns.  Fasting BG levels are appropriate, but evening levels are still significantly elevated on most days.  Will increase Trulicity to '3mg'$  and continue with CGM.  Continue to monitor diet and increase activity weekly.  She has two doses of medication left and is need of samples as her insurance is not covering this medication due to high deductible. Will work to see if we can get additional samples for her.  Refill on [place] sent to pharmacy.  F/U in 3 months or sooner if needed      Relevant Orders   Ambulatory referral to Ophthalmology   Hemoglobin A1c (Completed)   HM DIABETES FOOT EXAM (Completed)   CBC With Diff/Platelet (Completed)   Other Visit Diagnoses     Thrombocythemia           Meds ordered this encounter  Medications   DISCONTD: Dulaglutide (TRULICITY) 3 0000000 SOPN    Sig: Inject 3 mg as directed once a week.    Dispense:  2 mL    Refill:  5    Follow-up: Return in about 6 months (around 04/17/2022) for DM, HTN.    Orma Render, NP

## 2021-10-15 NOTE — Patient Instructions (Signed)
I will get in touch with the representative for the Trulicty to see if we can get more samples in and I will let you know once we have those available.  ? ?I recommend looking online to apply for the Trulicity patient assistance program that may help cover the cost. This is usually based on your income, but it would be worth trying. I have many patients who are able to get this at a much reduced cost.  ? ? ?

## 2021-10-16 NOTE — Assessment & Plan Note (Signed)
Chronic. Improved control with Trulicity 1.'5mg'$ .  ?She has lost about 10 lbs which is fantastic.  ?She is having no symptoms.  ?Review if CGM today with LibreView to evaluate control and patterns.  ?Fasting BG levels are appropriate, but evening levels are still significantly elevated on most days.  ?Will increase Trulicity to '3mg'$  and continue with CGM.  ?Continue to monitor diet and increase activity weekly.  ?She has two doses of medication left and is need of samples as her insurance is not covering this medication due to high deductible. Will work to see if we can get additional samples for her.  ?Refill on [place] sent to pharmacy.  ?F/U in 3 months or sooner if needed ?

## 2021-10-16 NOTE — Assessment & Plan Note (Signed)
Chronic. BP well controlled today in office. ?No alarm sx present.  ?Continue current medication regimen.  ?Not currently on statin therapy- encourage this.  ?Labs today. ?F/U in 3 months ?

## 2021-10-18 ENCOUNTER — Encounter (HOSPITAL_BASED_OUTPATIENT_CLINIC_OR_DEPARTMENT_OTHER): Payer: Self-pay | Admitting: Nurse Practitioner

## 2021-10-18 NOTE — Addendum Note (Signed)
Addended by: Keyairra Kolinski, Clarise Cruz E on: 10/18/2021 05:16 PM ? ? Modules accepted: Orders ? ?

## 2021-10-21 NOTE — Progress Notes (Signed)
Reached pt and scheduled for 3-24 ?

## 2021-11-01 ENCOUNTER — Other Ambulatory Visit (HOSPITAL_BASED_OUTPATIENT_CLINIC_OR_DEPARTMENT_OTHER): Payer: Self-pay | Admitting: Nurse Practitioner

## 2021-11-01 ENCOUNTER — Ambulatory Visit (HOSPITAL_BASED_OUTPATIENT_CLINIC_OR_DEPARTMENT_OTHER): Payer: BC Managed Care – PPO

## 2021-11-01 ENCOUNTER — Other Ambulatory Visit: Payer: Self-pay

## 2021-11-01 DIAGNOSIS — D75839 Thrombocytosis, unspecified: Secondary | ICD-10-CM | POA: Diagnosis not present

## 2021-11-01 LAB — CBC WITH DIFFERENTIAL/PLATELET
Basophils Absolute: 0 10*3/uL (ref 0.0–0.2)
Basos: 0 %
EOS (ABSOLUTE): 0.1 10*3/uL (ref 0.0–0.4)
Eos: 1 %
Hematocrit: 37.6 % (ref 34.0–46.6)
Hemoglobin: 12.1 g/dL (ref 11.1–15.9)
Immature Grans (Abs): 0 10*3/uL (ref 0.0–0.1)
Immature Granulocytes: 0 %
Lymphocytes Absolute: 2.1 10*3/uL (ref 0.7–3.1)
Lymphs: 28 %
MCH: 27.4 pg (ref 26.6–33.0)
MCHC: 32.2 g/dL (ref 31.5–35.7)
MCV: 85 fL (ref 79–97)
Monocytes Absolute: 0.6 10*3/uL (ref 0.1–0.9)
Monocytes: 8 %
Neutrophils Absolute: 4.7 10*3/uL (ref 1.4–7.0)
Neutrophils: 63 %
Platelets: 382 10*3/uL (ref 150–450)
RBC: 4.42 x10E6/uL (ref 3.77–5.28)
RDW: 13.1 % (ref 11.7–15.4)
WBC: 7.5 10*3/uL (ref 3.4–10.8)

## 2021-11-20 ENCOUNTER — Encounter: Payer: BC Managed Care – PPO | Admitting: Obstetrics and Gynecology

## 2022-02-24 ENCOUNTER — Other Ambulatory Visit (HOSPITAL_BASED_OUTPATIENT_CLINIC_OR_DEPARTMENT_OTHER): Payer: Self-pay | Admitting: Nurse Practitioner

## 2022-02-24 DIAGNOSIS — I10 Essential (primary) hypertension: Secondary | ICD-10-CM

## 2022-02-24 MED ORDER — AMLODIPINE BESYLATE 2.5 MG PO TABS: 2.5000 mg | ORAL_TABLET | Freq: Every day | ORAL | 3 refills | Status: DC

## 2022-03-25 ENCOUNTER — Encounter (HOSPITAL_BASED_OUTPATIENT_CLINIC_OR_DEPARTMENT_OTHER): Payer: Self-pay | Admitting: Nurse Practitioner

## 2022-04-18 ENCOUNTER — Ambulatory Visit (HOSPITAL_BASED_OUTPATIENT_CLINIC_OR_DEPARTMENT_OTHER): Payer: BC Managed Care – PPO | Admitting: Nurse Practitioner

## 2022-04-21 ENCOUNTER — Other Ambulatory Visit (HOSPITAL_BASED_OUTPATIENT_CLINIC_OR_DEPARTMENT_OTHER): Payer: Self-pay

## 2022-04-22 ENCOUNTER — Encounter (HOSPITAL_BASED_OUTPATIENT_CLINIC_OR_DEPARTMENT_OTHER): Payer: Self-pay | Admitting: Nurse Practitioner

## 2022-04-22 ENCOUNTER — Other Ambulatory Visit (HOSPITAL_BASED_OUTPATIENT_CLINIC_OR_DEPARTMENT_OTHER): Payer: Self-pay

## 2022-04-23 ENCOUNTER — Other Ambulatory Visit (HOSPITAL_BASED_OUTPATIENT_CLINIC_OR_DEPARTMENT_OTHER): Payer: Self-pay

## 2022-04-25 ENCOUNTER — Ambulatory Visit (HOSPITAL_BASED_OUTPATIENT_CLINIC_OR_DEPARTMENT_OTHER): Payer: BC Managed Care – PPO | Admitting: Nurse Practitioner

## 2022-06-11 ENCOUNTER — Ambulatory Visit (HOSPITAL_BASED_OUTPATIENT_CLINIC_OR_DEPARTMENT_OTHER): Payer: BC Managed Care – PPO | Admitting: Nurse Practitioner

## 2022-06-18 ENCOUNTER — Encounter (HOSPITAL_BASED_OUTPATIENT_CLINIC_OR_DEPARTMENT_OTHER): Payer: Self-pay | Admitting: Nurse Practitioner

## 2022-06-19 ENCOUNTER — Other Ambulatory Visit (HOSPITAL_BASED_OUTPATIENT_CLINIC_OR_DEPARTMENT_OTHER): Payer: Self-pay | Admitting: Nurse Practitioner

## 2022-06-19 ENCOUNTER — Ambulatory Visit (INDEPENDENT_AMBULATORY_CARE_PROVIDER_SITE_OTHER): Payer: BC Managed Care – PPO | Admitting: Nurse Practitioner

## 2022-06-19 ENCOUNTER — Encounter (HOSPITAL_BASED_OUTPATIENT_CLINIC_OR_DEPARTMENT_OTHER): Payer: Self-pay | Admitting: Nurse Practitioner

## 2022-06-19 VITALS — BP 174/97 | HR 97 | Ht 66.0 in | Wt 165.0 lb

## 2022-06-19 DIAGNOSIS — D75839 Thrombocytosis, unspecified: Secondary | ICD-10-CM

## 2022-06-19 DIAGNOSIS — I10 Essential (primary) hypertension: Secondary | ICD-10-CM

## 2022-06-19 DIAGNOSIS — E1165 Type 2 diabetes mellitus with hyperglycemia: Secondary | ICD-10-CM

## 2022-06-19 DIAGNOSIS — Z1211 Encounter for screening for malignant neoplasm of colon: Secondary | ICD-10-CM | POA: Diagnosis not present

## 2022-06-19 LAB — POCT UA - MICROALBUMIN
Albumin/Creatinine Ratio, Urine, POC: <30
Creatinine, POC: 300 mg/dL
Microalbumin Ur, POC: 30 mg/L

## 2022-06-19 MED ORDER — OZEMPIC (0.25 OR 0.5 MG/DOSE) 2 MG/3ML ~~LOC~~ SOPN: PEN_INJECTOR | SUBCUTANEOUS | 1 refills | Status: DC

## 2022-06-19 NOTE — Patient Instructions (Addendum)
We will try to get you started on the Ozempic as soon as possible. Go online to [email or web address] and search for the coupon card for 3 months supply for $25 before going to the pharmacy.   I recommend trying to eat small snacks through the day. Look for snacks that have protein and carbohydrates together to keep your blood sugars stable.   I will be in touch with you to let you know what your labs look like.   I would like you to check your labs today and make sure everything looks ok.   If you would like to follow me to [place], you can call over there to schedule your follow-up in about 6 months. Their number is [phone].

## 2022-06-19 NOTE — Assessment & Plan Note (Addendum)
Chronic. Most recent A1c not resulted. Recommend: Daily glucose monitoring, AM fasting blood sugar 70-110, 2 hours after meals blood sugar <150, 150-180 grams of carbohydrates and 30 grams of protein per day. Exercise at least 20 minutes per day, Blood pressure monitoring weekly with goal <130/80. She would like to consider transition from trulicity to ozempic for improved control. Will see if this is covered by insurance.  Start diabetes medications ordered- stop trulicity once approval for ozempic has been completed.  Labs 6 months  Follow-Up: 6 months

## 2022-06-19 NOTE — Assessment & Plan Note (Signed)
Chronic. BP elevated at the time of visit- patient fasting for labs and dose needed. No alarm symptoms present at this time.  Goal blood pressure less than less than 130/80.  Refills  not needed  today.  Labs today to check electrolytes and kidney function.  Recommend current treatment plan is effective, no change in therapy, orders and follow up as documented in EpicCare, reviewed diet, exercise and weight control, labs ordered and recent results reviewed with patient. Currently is not followed with cardiology. We will make changes to plan of care as necessary based on lab results. Plan to follow-up in 84month.

## 2022-06-19 NOTE — Progress Notes (Signed)
[name], DNP, AGNP-c [address] [place], [place] [address] [phone] Office [phone] Fax  ESTABLISHED PATIENT- Chronic Health and/or Follow-Up Visit  Blood pressure (!) 174/97, pulse 97, height '5\' 6"'$  (1.676 m), weight 165 lb (74.8 kg), SpO2 100 %.    Sylvia Robinson is a 51 y.o. year old female presenting today for evaluation and management of the following: Follow-up (Pt presents today for 6 month follow up. Her sugar is under control, but its no longer controlling the hunger. Pt denied flu shot)  Diabetes Mellitus Type II, Follow-up: Patient here for follow-up of Type 2 diabetes mellitus.  Current symptoms/problems include increase appetite and polyuria and have been worsening. Symptoms have been present for several weeks.  Known diabetic complications: cardiovascular disease Cardiovascular risk factors: diabetes mellitus, dyslipidemia, and hypertension Current diabetic medications include  trulicity .   Eye exam current (within one year): no Weight trend: decreasing steadily Prior visit with dietician: yes  Current diet: in general, a "healthy" diet   Current exercise: none  Current monitoring regimen: home blood tests - daily and office lab tests - 4 times yearly Home blood sugar records:  stable Any episodes of hypoglycemia? no  Is She on ACE inhibitor or angiotensin II receptor blocker?  No, Not Indicated   Hypertension, follow-up  BP Readings from Last 3 Encounters:  06/19/22 (!) 174/97  10/15/21 122/82  07/17/21 130/82   Wt Readings from Last 3 Encounters:  06/19/22 165 lb (74.8 kg)  10/15/21 165 lb 3.2 oz (74.9 kg)  07/17/21 173 lb 8 oz (78.7 kg)     She was last seen for hypertension 6 months ago.  BP at that visit was well controlled. Management since that visit includes amlodipine.  She reports good compliance with treatment. She is not having side effects.  She is  following a Diabetic diet. She is not exercising. She does not smoke.  Use of agents associated with hypertension: none.   Outside blood pressures are not monitored. Symptoms: No chest pain No chest pressure  No palpitations No syncope  No dyspnea No orthopnea  No paroxysmal nocturnal dyspnea No lower extremity edema   Pertinent labs No results found for: "CHOL", "HDL", "LDLCALC", "LDLDIRECT", "TRIG", "CHOLHDL" Lab Results  Component Value Date   NA 138 11/24/2019   K 3.8 11/24/2019   CREATININE 1.22 (H) 11/24/2019   GFRNONAA 53 (L) 11/24/2019   GLUCOSE 145 (H) 11/24/2019     The ASCVD Risk score (Arnett DK, et al., 2019) failed to calculate for the following reasons:   Cannot find a previous HDL lab   Cannot find a previous total cholesterol lab  ---------------------------------------------------------------------------------------------------   All ROS negative with exception of what is listed above.   PHYSICAL EXAM Physical Exam Vitals and nursing note reviewed.  Constitutional:      General: She is not in acute distress.    Appearance: Normal appearance.  HENT:     Head: Normocephalic.  Eyes:     Extraocular Movements: Extraocular movements intact.     Conjunctiva/sclera: Conjunctivae normal.     Pupils: Pupils are equal, round, and reactive to light.  Neck:     Vascular: No carotid bruit.  Cardiovascular:     Rate and Rhythm: Normal rate and regular rhythm.     Pulses: Normal pulses.     Heart sounds: Normal heart sounds. No murmur heard. Pulmonary:     Effort: Pulmonary effort is normal.  Breath sounds: Normal breath sounds. No wheezing.  Abdominal:     General: Bowel sounds are normal. There is no distension.     Palpations: Abdomen is soft.     Tenderness: There is no abdominal tenderness. There is no guarding.  Musculoskeletal:        General: Normal range of motion.     Cervical back: Normal range of motion and neck supple.     Right lower  leg: No edema.     Left lower leg: No edema.  Lymphadenopathy:     Cervical: No cervical adenopathy.  Skin:    General: Skin is warm and dry.     Capillary Refill: Capillary refill takes less than 2 seconds.  Neurological:     General: No focal deficit present.     Mental Status: She is alert and oriented to person, place, and time.  Psychiatric:        Mood and Affect: Mood normal.        Behavior: Behavior normal.        Thought Content: Thought content normal.        Judgment: Judgment normal.     PLAN Problem List Items Addressed This Visit     Essential hypertension - Primary    Chronic. BP elevated at the time of visit- patient fasting for labs and dose needed. No alarm symptoms present at this time.  Goal blood pressure less than less than 130/80.  Refills  not needed  today.  Labs today to check electrolytes and kidney function.  Recommend current treatment plan is effective, no change in therapy, orders and follow up as documented in EpicCare, reviewed diet, exercise and weight control, labs ordered and recent results reviewed with patient. Currently is not followed with cardiology. We will make changes to plan of care as necessary based on lab results. Plan to follow-up in 79month.       Relevant Medications   Semaglutide,0.25 or 0.'5MG'$ /DOS, (OZEMPIC, 0.25 OR 0.5 MG/DOSE,) 2 MG/3ML SOPN   Other Relevant Orders   CBC with Differential/Platelet   Comprehensive metabolic panel   Hemoglobin A1c   Lipid panel   TSH   T4, free   VITAMIN D 25 Hydroxy (Vit-D Deficiency, Fractures)   POCT UA - Microalbumin (Completed)   Type 2 diabetes mellitus with hyperglycemia (HCC)    Chronic. Most recent A1c not resulted. Recommend: Daily glucose monitoring, AM fasting blood sugar 70-110, 2 hours after meals blood sugar <150, 150-180 grams of carbohydrates and 30 grams of protein per day. Exercise at least 20 minutes per day, Blood pressure monitoring weekly with goal <130/80. She would  like to consider transition from trulicity to ozempic for improved control. Will see if this is covered by insurance.  Start diabetes medications ordered- stop trulicity once approval for ozempic has been completed.  Labs 6 months  Follow-Up: 6 months       Relevant Medications   Semaglutide,0.25 or 0.'5MG'$ /DOS, (OZEMPIC, 0.25 OR 0.5 MG/DOSE,) 2 MG/3ML SOPN   Other Relevant Orders   CBC with Differential/Platelet   Comprehensive metabolic panel   Hemoglobin A1c   Lipid panel   TSH   T4, free   VITAMIN D 25 Hydroxy (Vit-D Deficiency, Fractures)   POCT UA - Microalbumin (Completed)   Other Visit Diagnoses     Thrombocythemia       Relevant Orders   CBC with Differential/Platelet   Screening for colon cancer       Relevant Orders  Cologuard       Return in about 6 months (around 12/18/2022) for DM, HTN.   [name], DNP, AGNP-c 06/19/2022  8:26 AM

## 2022-06-20 ENCOUNTER — Encounter (HOSPITAL_BASED_OUTPATIENT_CLINIC_OR_DEPARTMENT_OTHER): Payer: Self-pay | Admitting: Nurse Practitioner

## 2022-06-20 DIAGNOSIS — E1165 Type 2 diabetes mellitus with hyperglycemia: Secondary | ICD-10-CM

## 2022-06-20 DIAGNOSIS — I10 Essential (primary) hypertension: Secondary | ICD-10-CM

## 2022-06-20 LAB — COMPREHENSIVE METABOLIC PANEL
ALT: 9 IU/L (ref 0–32)
AST: 10 IU/L (ref 0–40)
Albumin/Globulin Ratio: 1.4 (ref 1.2–2.2)
Albumin: 4.2 g/dL (ref 3.9–4.9)
Alkaline Phosphatase: 100 IU/L (ref 44–121)
BUN/Creatinine Ratio: 14 (ref 9–23)
BUN: 13 mg/dL (ref 6–24)
Bilirubin Total: 0.2 mg/dL (ref 0.0–1.2)
CO2: 22 mmol/L (ref 20–29)
Calcium: 9.6 mg/dL (ref 8.7–10.2)
Chloride: 100 mmol/L (ref 96–106)
Creatinine, Ser: 0.94 mg/dL (ref 0.57–1.00)
Globulin, Total: 3.1 g/dL (ref 1.5–4.5)
Glucose: 199 mg/dL — ABNORMAL HIGH (ref 70–99)
Potassium: 4.4 mmol/L (ref 3.5–5.2)
Sodium: 137 mmol/L (ref 134–144)
Total Protein: 7.3 g/dL (ref 6.0–8.5)
eGFR: 74 mL/min/{1.73_m2} (ref >59)

## 2022-06-20 LAB — CBC WITH DIFFERENTIAL/PLATELET
Basophils Absolute: 0 10*3/uL (ref 0.0–0.2)
Basos: 0 %
EOS (ABSOLUTE): 0.1 10*3/uL (ref 0.0–0.4)
Eos: 1 %
Hematocrit: 38.6 % (ref 34.0–46.6)
Hemoglobin: 12.4 g/dL (ref 11.1–15.9)
Immature Grans (Abs): 0 10*3/uL (ref 0.0–0.1)
Immature Granulocytes: 0 %
Lymphocytes Absolute: 1.9 10*3/uL (ref 0.7–3.1)
Lymphs: 25 %
MCH: 27.1 pg (ref 26.6–33.0)
MCHC: 32.1 g/dL (ref 31.5–35.7)
MCV: 85 fL (ref 79–97)
Monocytes Absolute: 0.6 10*3/uL (ref 0.1–0.9)
Monocytes: 8 %
Neutrophils Absolute: 5.1 10*3/uL (ref 1.4–7.0)
Neutrophils: 66 %
Platelets: 432 10*3/uL (ref 150–450)
RBC: 4.57 x10E6/uL (ref 3.77–5.28)
RDW: 13.2 % (ref 11.7–15.4)
WBC: 7.7 10*3/uL (ref 3.4–10.8)

## 2022-06-20 LAB — LIPID PANEL
Chol/HDL Ratio: 3.9 ratio (ref 0.0–4.4)
Cholesterol, Total: 214 mg/dL — ABNORMAL HIGH (ref 100–199)
HDL: 55 mg/dL (ref >39)
LDL Chol Calc (NIH): 139 mg/dL — ABNORMAL HIGH (ref 0–99)
Triglycerides: 114 mg/dL (ref 0–149)
VLDL Cholesterol Cal: 20 mg/dL (ref 5–40)

## 2022-06-20 LAB — VITAMIN D 25 HYDROXY (VIT D DEFICIENCY, FRACTURES): Vit D, 25-Hydroxy: 34 ng/mL (ref 30.0–100.0)

## 2022-06-20 LAB — HEMOGLOBIN A1C
Est. average glucose Bld gHb Est-mCnc: 169 mg/dL
Hgb A1c MFr Bld: 7.5 % — ABNORMAL HIGH (ref 4.8–5.6)

## 2022-06-20 LAB — TSH: TSH: 1.66 u[IU]/mL (ref 0.450–4.500)

## 2022-06-20 LAB — T4, FREE: Free T4: 1.25 ng/dL (ref 0.82–1.77)

## 2022-06-26 ENCOUNTER — Other Ambulatory Visit (HOSPITAL_BASED_OUTPATIENT_CLINIC_OR_DEPARTMENT_OTHER): Payer: Self-pay | Admitting: Nurse Practitioner

## 2022-06-26 DIAGNOSIS — E1165 Type 2 diabetes mellitus with hyperglycemia: Secondary | ICD-10-CM

## 2022-06-26 DIAGNOSIS — I10 Essential (primary) hypertension: Secondary | ICD-10-CM

## 2022-06-26 MED ORDER — OZEMPIC (0.25 OR 0.5 MG/DOSE) 2 MG/3ML ~~LOC~~ SOPN: PEN_INJECTOR | SUBCUTANEOUS | 1 refills | Status: DC

## 2022-06-30 ENCOUNTER — Ambulatory Visit (HOSPITAL_BASED_OUTPATIENT_CLINIC_OR_DEPARTMENT_OTHER): Payer: BC Managed Care – PPO | Admitting: Nurse Practitioner

## 2022-07-03 MED ORDER — TRULICITY 3 MG/0.5ML ~~LOC~~ SOAJ: 3.0000 mg | SUBCUTANEOUS | 5 refills | Status: DC

## 2022-07-03 NOTE — Addendum Note (Signed)
Addended by: Shontel Santee, Clarise Cruz E on: 07/03/2022 11:24 PM   Modules accepted: Orders

## 2022-07-07 DIAGNOSIS — Z1211 Encounter for screening for malignant neoplasm of colon: Secondary | ICD-10-CM | POA: Diagnosis not present

## 2022-07-11 DIAGNOSIS — Z1231 Encounter for screening mammogram for malignant neoplasm of breast: Secondary | ICD-10-CM | POA: Diagnosis not present

## 2022-07-15 LAB — COLOGUARD: COLOGUARD: NEGATIVE

## 2022-07-18 ENCOUNTER — Encounter: Payer: Self-pay | Admitting: Nurse Practitioner

## 2022-08-15 ENCOUNTER — Ambulatory Visit (HOSPITAL_COMMUNITY): Payer: BC Managed Care – PPO

## 2022-09-24 DIAGNOSIS — Z01419 Encounter for gynecological examination (general) (routine) without abnormal findings: Secondary | ICD-10-CM | POA: Diagnosis not present

## 2022-09-24 DIAGNOSIS — Z6824 Body mass index (BMI) 24.0-24.9, adult: Secondary | ICD-10-CM | POA: Diagnosis not present

## 2022-09-24 DIAGNOSIS — Z124 Encounter for screening for malignant neoplasm of cervix: Secondary | ICD-10-CM | POA: Diagnosis not present

## 2022-09-24 LAB — HM PAP SMEAR: HM Pap smear: NEGATIVE

## 2022-10-03 ENCOUNTER — Ambulatory Visit: Payer: Managed Care, Other (non HMO) | Admitting: Nurse Practitioner

## 2022-10-16 ENCOUNTER — Encounter: Payer: Self-pay | Admitting: Nurse Practitioner

## 2022-10-16 ENCOUNTER — Ambulatory Visit: Payer: BC Managed Care – PPO | Admitting: Nurse Practitioner

## 2022-10-16 VITALS — BP 132/78 | HR 100 | Ht 67.0 in | Wt 161.6 lb

## 2022-10-16 DIAGNOSIS — N898 Other specified noninflammatory disorders of vagina: Secondary | ICD-10-CM | POA: Diagnosis not present

## 2022-10-16 DIAGNOSIS — E1165 Type 2 diabetes mellitus with hyperglycemia: Secondary | ICD-10-CM | POA: Diagnosis not present

## 2022-10-16 DIAGNOSIS — E559 Vitamin D deficiency, unspecified: Secondary | ICD-10-CM | POA: Diagnosis not present

## 2022-10-16 DIAGNOSIS — I10 Essential (primary) hypertension: Secondary | ICD-10-CM | POA: Diagnosis not present

## 2022-10-16 LAB — LIPID PANEL

## 2022-10-16 MED ORDER — AMLODIPINE BESYLATE 2.5 MG PO TABS: 2.5000 mg | ORAL_TABLET | Freq: Every day | ORAL | 3 refills | Status: DC

## 2022-10-16 MED ORDER — TRULICITY 3 MG/0.5ML ~~LOC~~ SOAJ: 3.0000 mg | SUBCUTANEOUS | 3 refills | Status: DC

## 2022-10-16 NOTE — Assessment & Plan Note (Signed)
No reported concerns or symptoms at this time. Her blood pressures are well controlled at home. She continues on amlodipine 2.'5mg'$  with no edema.  Plan: - continue current regimen

## 2022-10-16 NOTE — Assessment & Plan Note (Signed)
The patient's blood sugars have shown improvement. However, is experiencing intermittent side effects from Trulicity, including nausea and constipation. Itching was reported, initially suspected to be a yeast infection secondary to Trulicity, but no infection was confirmed. Nausea typically occurs once a week post-Trulicity injection but is managed with Pepto-Bismol. Constipation has seen improvement with increased physical activity and hydration. The patient also reports occasional blurry vision at night, potentially related to rapid blood sugar control. Plan: - Continue the current regimen of Trulicity, as it effectively controls blood sugars. - Monitor for any worsening of nausea or constipation; consider alternative treatments if symptoms become unmanageable. - Encourage continued hydration and physical activity to manage constipation. - Advise the patient to monitor for any persistent or worsening vision changes and to schedule an appointment with an ophthalmologist for a comprehensive eye exam to evaluate for diabetic retinopathy. - Discuss the importance of regular meals and snacks to manage appetite and weight, suggesting high-protein and carbohydrate snacks like [place] yogurt with fruit. - Send in a 90-day supply prescription for Trulicity to [facility].

## 2022-10-16 NOTE — Assessment & Plan Note (Signed)
The patient reports itching, particularly during the menstrual cycle and when exposed to heat. No infection was found during the gynecological evaluation. Itching is relieved with topical cream. Plan: - Continue the use of topical cream as needed for symptomatic relief. - Consider a trial of Diflucan if itching persists or worsens, to rule out a yeast infection.

## 2022-10-16 NOTE — Progress Notes (Signed)
Sylvia Keeler, DNP, AGNP-c [facility]  [address] [place], [place] [address] [phone]  ESTABLISHED PATIENT- Chronic Health and/or Follow-Up Visit  Blood pressure 132/78, pulse 100, height '5\' 7"'$  (1.702 m), weight 161 lb 9.6 oz (73.3 kg), last menstrual period 10/13/2022.    Sylvia Robinson is a 52 y.o. year old female presenting today for evaluation and management of the following:  Sylvia Robinson presents today with updates on her condition since starting Trulicity for diabetes management. She reports an improvement in blood sugar levels, maintaining an average around 100, which she attributes to the medication. However, she experiences side effects, including occasional nausea typically one day per week post-administration and constipation. To alleviate nausea, the patient has been using Pepto, though she recognizes it may exacerbate her constipation. She has proactively increased her water intake and physical activity, which has ameliorated both constipation and headaches.  Additionally, the patient mentions experiencing itching, particularly noticeable during her menstrual cycle, which she initially thought was due to heat. Following a consultation with her gynecologist, it was suggested that Trulicity might be contributing to yeast infections. A prescribed cream has been effective in alleviating the itching, and the patient denies any unbearable discomfort from this condition.  The patient has also observed occasional blurry vision at night when lying down, suspecting a potential link to Trulicity. Despite these side effects, she reports her blood sugars are generally well-controlled.   She shares a family history of diabetes, noting her father's complications from poor disease management, underscoring her commitment to controlling her blood sugar levels to avoid similar complications. The patient denies experiencing any numbness or tingling in her feet and reports  no swelling in her feet or legs. Although not consistently using a continuous glucose monitor, she continues to monitor her blood sugar levels through finger pricking.  All ROS negative with exception of what is listed above.   PHYSICAL EXAM Physical Exam Vitals and nursing note reviewed.  Constitutional:      Appearance: Normal appearance.  HENT:     Head: Normocephalic.  Eyes:     Pupils: Pupils are equal, round, and reactive to light.  Neck:     Comments: Right side Cardiovascular:     Rate and Rhythm: Normal rate and regular rhythm.     Pulses: Normal pulses.     Heart sounds: Murmur heard.  Pulmonary:     Effort: Pulmonary effort is normal.     Breath sounds: Normal breath sounds.  Abdominal:     General: Bowel sounds are normal.     Palpations: Abdomen is soft.  Musculoskeletal:        General: Normal range of motion.     Cervical back: Normal range of motion.  Skin:    General: Skin is warm and dry.     Capillary Refill: Capillary refill takes less than 2 seconds.  Neurological:     General: No focal deficit present.     Mental Status: She is alert.  Psychiatric:        Mood and Affect: Mood normal.     PLAN Problem List Items Addressed This Visit     Essential hypertension    No reported concerns or symptoms at this time. Her blood pressures are well controlled at home. She continues on amlodipine 2.'5mg'$  with no edema.  Plan: - continue current regimen      Relevant Medications   Dulaglutide (TRULICITY) 3 0000000 SOPN   amLODipine (NORVASC) 2.5 MG tablet   Other Relevant Orders  CBC with Differential/Platelet   Comprehensive metabolic panel   Lipid panel   VITAMIN D 25 Hydroxy (Vit-D Deficiency, Fractures)   Hemoglobin A1c   Vitamin B12   Type 2 diabetes mellitus with hyperglycemia ([facility]) - Primary    The patient's blood sugars have shown improvement. However, is experiencing intermittent side effects from Trulicity, including nausea and  constipation. Itching was reported, initially suspected to be a yeast infection secondary to Trulicity, but no infection was confirmed. Nausea typically occurs once a week post-Trulicity injection but is managed with Pepto-Bismol. Constipation has seen improvement with increased physical activity and hydration. The patient also reports occasional blurry vision at night, potentially related to rapid blood sugar control. Plan: - Continue the current regimen of Trulicity, as it effectively controls blood sugars. - Monitor for any worsening of nausea or constipation; consider alternative treatments if symptoms become unmanageable. - Encourage continued hydration and physical activity to manage constipation. - Advise the patient to monitor for any persistent or worsening vision changes and to schedule an appointment with an ophthalmologist for a comprehensive eye exam to evaluate for diabetic retinopathy. - Discuss the importance of regular meals and snacks to manage appetite and weight, suggesting high-protein and carbohydrate snacks like [place] yogurt with fruit. - Send in a 90-day supply prescription for Trulicity to [facility].      Relevant Medications   Dulaglutide (TRULICITY) 3 0000000 SOPN   amLODipine (NORVASC) 2.5 MG tablet   Other Relevant Orders   CBC with Differential/Platelet   Comprehensive metabolic panel   Lipid panel   VITAMIN D 25 Hydroxy (Vit-D Deficiency, Fractures)   Hemoglobin A1c   Vitamin B12   Vaginal pruritus    The patient reports itching, particularly during the menstrual cycle and when exposed to heat. No infection was found during the gynecological evaluation. Itching is relieved with topical cream. Plan: - Continue the use of topical cream as needed for symptomatic relief. - Consider a trial of Diflucan if itching persists or worsens, to rule out a yeast infection.      Other Visit Diagnoses     Vitamin D deficiency       Relevant Medications    Dulaglutide (TRULICITY) 3 0000000 SOPN   amLODipine (NORVASC) 2.5 MG tablet   Other Relevant Orders   CBC with Differential/Platelet   Comprehensive metabolic panel   Lipid panel   VITAMIN D 25 Hydroxy (Vit-D Deficiency, Fractures)   Hemoglobin A1c   Vitamin B12       Return in about 6 months (around 04/18/2023) for Med Management.   Sylvia Keeler, DNP, AGNP-c 10/16/2022 10:24 AM

## 2022-10-16 NOTE — Patient Instructions (Signed)
I am so proud of you!!!   Keep up the great work!!

## 2022-10-17 ENCOUNTER — Encounter: Payer: Self-pay | Admitting: Nurse Practitioner

## 2022-10-17 DIAGNOSIS — E1165 Type 2 diabetes mellitus with hyperglycemia: Secondary | ICD-10-CM

## 2022-10-17 DIAGNOSIS — E1169 Type 2 diabetes mellitus with other specified complication: Secondary | ICD-10-CM

## 2022-10-17 DIAGNOSIS — I10 Essential (primary) hypertension: Secondary | ICD-10-CM

## 2022-10-17 DIAGNOSIS — E559 Vitamin D deficiency, unspecified: Secondary | ICD-10-CM

## 2022-10-17 LAB — COMPREHENSIVE METABOLIC PANEL
ALT: 7 IU/L (ref 0–32)
AST: 14 IU/L (ref 0–40)
Albumin/Globulin Ratio: 1.4 (ref 1.2–2.2)
Albumin: 4.2 g/dL (ref 3.9–4.9)
Alkaline Phosphatase: 90 IU/L (ref 44–121)
BUN/Creatinine Ratio: 13 (ref 9–23)
BUN: 12 mg/dL (ref 6–24)
Bilirubin Total: <0.2 mg/dL (ref 0.0–1.2)
CO2: 24 mmol/L (ref 20–29)
Calcium: 9.3 mg/dL (ref 8.7–10.2)
Chloride: 103 mmol/L (ref 96–106)
Creatinine, Ser: 0.95 mg/dL (ref 0.57–1.00)
Globulin, Total: 3.1 g/dL (ref 1.5–4.5)
Glucose: 104 mg/dL — ABNORMAL HIGH (ref 70–99)
Potassium: 4.3 mmol/L (ref 3.5–5.2)
Sodium: 140 mmol/L (ref 134–144)
Total Protein: 7.3 g/dL (ref 6.0–8.5)
eGFR: 73 mL/min/{1.73_m2} (ref >59)

## 2022-10-17 LAB — LIPID PANEL
Chol/HDL Ratio: 3.6 ratio (ref 0.0–4.4)
Cholesterol, Total: 217 mg/dL — ABNORMAL HIGH (ref 100–199)
HDL: 60 mg/dL (ref >39)
LDL Chol Calc (NIH): 144 mg/dL — ABNORMAL HIGH (ref 0–99)
Triglycerides: 73 mg/dL (ref 0–149)
VLDL Cholesterol Cal: 13 mg/dL (ref 5–40)

## 2022-10-17 LAB — CBC WITH DIFFERENTIAL/PLATELET
Basophils Absolute: 0 10*3/uL (ref 0.0–0.2)
Basos: 1 %
EOS (ABSOLUTE): 0.1 10*3/uL (ref 0.0–0.4)
Eos: 1 %
Hematocrit: 32 % — ABNORMAL LOW (ref 34.0–46.6)
Hemoglobin: 10.1 g/dL — ABNORMAL LOW (ref 11.1–15.9)
Immature Grans (Abs): 0 10*3/uL (ref 0.0–0.1)
Immature Granulocytes: 0 %
Lymphocytes Absolute: 1.9 10*3/uL (ref 0.7–3.1)
Lymphs: 27 %
MCH: 24.5 pg — ABNORMAL LOW (ref 26.6–33.0)
MCHC: 31.6 g/dL (ref 31.5–35.7)
MCV: 78 fL — ABNORMAL LOW (ref 79–97)
Monocytes Absolute: 0.6 10*3/uL (ref 0.1–0.9)
Monocytes: 8 %
Neutrophils Absolute: 4.3 10*3/uL (ref 1.4–7.0)
Neutrophils: 63 %
Platelets: 509 10*3/uL — ABNORMAL HIGH (ref 150–450)
RBC: 4.12 x10E6/uL (ref 3.77–5.28)
RDW: 14 % (ref 11.7–15.4)
WBC: 6.9 10*3/uL (ref 3.4–10.8)

## 2022-10-17 LAB — VITAMIN D 25 HYDROXY (VIT D DEFICIENCY, FRACTURES): Vit D, 25-Hydroxy: 42.9 ng/mL (ref 30.0–100.0)

## 2022-10-17 LAB — HEMOGLOBIN A1C
Est. average glucose Bld gHb Est-mCnc: 157 mg/dL
Hgb A1c MFr Bld: 7.1 % — ABNORMAL HIGH (ref 4.8–5.6)

## 2022-10-17 LAB — VITAMIN B12: Vitamin B-12: 733 pg/mL (ref 232–1245)

## 2022-10-20 ENCOUNTER — Other Ambulatory Visit: Payer: Self-pay

## 2022-10-20 DIAGNOSIS — R7989 Other specified abnormal findings of blood chemistry: Secondary | ICD-10-CM

## 2022-10-21 MED ORDER — TRULICITY 4.5 MG/0.5ML ~~LOC~~ SOAJ: 4.5000 mg | SUBCUTANEOUS | 3 refills | Status: DC

## 2022-10-21 MED ORDER — ROSUVASTATIN CALCIUM 10 MG PO TABS: 10.0000 mg | ORAL_TABLET | ORAL | 3 refills | Status: DC

## 2022-10-28 ENCOUNTER — Other Ambulatory Visit: Payer: BC Managed Care – PPO

## 2022-10-28 DIAGNOSIS — R7989 Other specified abnormal findings of blood chemistry: Secondary | ICD-10-CM

## 2022-10-28 LAB — CBC WITH DIFFERENTIAL/PLATELET
Basophils Absolute: 0 10*3/uL (ref 0.0–0.2)
Basos: 1 %
EOS (ABSOLUTE): 0.2 10*3/uL (ref 0.0–0.4)
Eos: 2 %
Hematocrit: 32.5 % — ABNORMAL LOW (ref 34.0–46.6)
Hemoglobin: 10.2 g/dL — ABNORMAL LOW (ref 11.1–15.9)
Immature Grans (Abs): 0 10*3/uL (ref 0.0–0.1)
Immature Granulocytes: 0 %
Lymphocytes Absolute: 2.6 10*3/uL (ref 0.7–3.1)
Lymphs: 34 %
MCH: 24.8 pg — ABNORMAL LOW (ref 26.6–33.0)
MCHC: 31.4 g/dL — ABNORMAL LOW (ref 31.5–35.7)
MCV: 79 fL (ref 79–97)
Monocytes Absolute: 0.7 10*3/uL (ref 0.1–0.9)
Monocytes: 10 %
Neutrophils Absolute: 4.1 10*3/uL (ref 1.4–7.0)
Neutrophils: 53 %
Platelets: 513 10*3/uL — ABNORMAL HIGH (ref 150–450)
RBC: 4.12 x10E6/uL (ref 3.77–5.28)
RDW: 14.3 % (ref 11.7–15.4)
WBC: 7.6 10*3/uL (ref 3.4–10.8)

## 2022-10-30 ENCOUNTER — Encounter: Payer: Self-pay | Admitting: Nurse Practitioner

## 2023-04-20 ENCOUNTER — Encounter: Payer: Self-pay | Admitting: Nurse Practitioner

## 2023-04-20 ENCOUNTER — Ambulatory Visit (INDEPENDENT_AMBULATORY_CARE_PROVIDER_SITE_OTHER): Payer: BC Managed Care – PPO | Admitting: Nurse Practitioner

## 2023-04-20 VITALS — BP 128/78 | HR 92 | Wt 164.2 lb

## 2023-04-20 DIAGNOSIS — N898 Other specified noninflammatory disorders of vagina: Secondary | ICD-10-CM | POA: Diagnosis not present

## 2023-04-20 DIAGNOSIS — Z23 Encounter for immunization: Secondary | ICD-10-CM

## 2023-04-20 DIAGNOSIS — E1169 Type 2 diabetes mellitus with other specified complication: Secondary | ICD-10-CM | POA: Diagnosis not present

## 2023-04-20 DIAGNOSIS — I152 Hypertension secondary to endocrine disorders: Secondary | ICD-10-CM

## 2023-04-20 DIAGNOSIS — E785 Hyperlipidemia, unspecified: Secondary | ICD-10-CM | POA: Diagnosis not present

## 2023-04-20 DIAGNOSIS — E1159 Type 2 diabetes mellitus with other circulatory complications: Secondary | ICD-10-CM

## 2023-04-20 LAB — CBC WITH DIFFERENTIAL/PLATELET
Basophils Absolute: 0.1 10*3/uL (ref 0.0–0.2)
Basos: 1 %
EOS (ABSOLUTE): 0.3 10*3/uL (ref 0.0–0.4)
Eos: 4 %
Hematocrit: 29.8 % — ABNORMAL LOW (ref 34.0–46.6)
Hemoglobin: 9.3 g/dL — ABNORMAL LOW (ref 11.1–15.9)
Immature Grans (Abs): 0 10*3/uL (ref 0.0–0.1)
Immature Granulocytes: 0 %
Lymphocytes Absolute: 2.5 10*3/uL (ref 0.7–3.1)
Lymphs: 31 %
MCH: 24.2 pg — ABNORMAL LOW (ref 26.6–33.0)
MCHC: 31.2 g/dL — ABNORMAL LOW (ref 31.5–35.7)
MCV: 78 fL — ABNORMAL LOW (ref 79–97)
Monocytes Absolute: 0.9 10*3/uL (ref 0.1–0.9)
Monocytes: 11 %
Neutrophils Absolute: 4.2 10*3/uL (ref 1.4–7.0)
Neutrophils: 53 %
Platelets: 435 10*3/uL (ref 150–450)
RBC: 3.84 x10E6/uL (ref 3.77–5.28)
RDW: 13.6 % (ref 11.7–15.4)
WBC: 8 10*3/uL (ref 3.4–10.8)

## 2023-04-20 LAB — COMPREHENSIVE METABOLIC PANEL
ALT: 10 IU/L (ref 0–32)
AST: 14 IU/L (ref 0–40)
Albumin: 4.1 g/dL (ref 3.9–4.9)
Alkaline Phosphatase: 84 IU/L (ref 44–121)
BUN/Creatinine Ratio: 22 (ref 9–23)
BUN: 22 mg/dL (ref 6–24)
Bilirubin Total: <0.2 mg/dL (ref 0.0–1.2)
CO2: 21 mmol/L (ref 20–29)
Calcium: 9.1 mg/dL (ref 8.7–10.2)
Chloride: 103 mmol/L (ref 96–106)
Creatinine, Ser: 0.99 mg/dL (ref 0.57–1.00)
Globulin, Total: 2.6 g/dL (ref 1.5–4.5)
Glucose: 136 mg/dL — ABNORMAL HIGH (ref 70–99)
Potassium: 4.2 mmol/L (ref 3.5–5.2)
Sodium: 136 mmol/L (ref 134–144)
Total Protein: 6.7 g/dL (ref 6.0–8.5)
eGFR: 69 mL/min/{1.73_m2} (ref >59)

## 2023-04-20 LAB — LIPID PANEL
Chol/HDL Ratio: 2.3 ratio (ref 0.0–4.4)
Cholesterol, Total: 117 mg/dL (ref 100–199)
HDL: 52 mg/dL (ref >39)
LDL Chol Calc (NIH): 47 mg/dL (ref 0–99)
Triglycerides: 92 mg/dL (ref 0–149)
VLDL Cholesterol Cal: 18 mg/dL (ref 5–40)

## 2023-04-20 LAB — HEMOGLOBIN A1C
Est. average glucose Bld gHb Est-mCnc: 163 mg/dL
Hgb A1c MFr Bld: 7.3 % — ABNORMAL HIGH (ref 4.8–5.6)

## 2023-04-20 MED ORDER — MOUNJARO 5 MG/0.5ML ~~LOC~~ SOAJ: 5.0000 mg | SUBCUTANEOUS | 0 refills | Status: DC

## 2023-04-20 MED ORDER — CLOBETASOL PROPIONATE 0.05 % EX CREA: 1.0000 | TOPICAL_CREAM | Freq: Two times a day (BID) | CUTANEOUS | 3 refills | Status: AC

## 2023-04-20 NOTE — Progress Notes (Unsigned)
  Shawna Clamp, DNP, AGNP-c [facility]  [address] [place], [place] [address] [phone]  ESTABLISHED PATIENT- Chronic Health and/or Follow-Up Visit  There were no vitals taken for this visit.    Sylvia Robinson is a 52 y.o. year old female presenting today for evaluation and management of chronic conditions.   DM/HLD/HTN She tells me she she has not been checking her blood sugars and she did not get the CGM filled. She tells me that she is still taking the Trulicty. She feels like it is working, but she feels more hungry, especially on the day that she takes the injection. She tells me is she surprised by her weight because she has been more active with walking daily. She tells me she feels like her clothes are fitting much better, but she is worried about the increase in her weight. She doesn't feel like the dose is working as well. She is on the highest dose. She has been on this for over a year.   She tells me she has had some headaches   All ROS negative with exception of what is listed above.   PHYSICAL EXAM Physical Exam  PLAN Problem List Items Addressed This Visit     Hypertension associated with diabetes (HCC)   Type 2 diabetes mellitus with hyperglycemia (HCC) - Primary    No follow-ups on file.  Time: *** minutes, >50% spent counseling, care coordination, chart review, and documentation.   Shawna Clamp, DNP, AGNP-c

## 2023-04-20 NOTE — Patient Instructions (Signed)
For best management of weight, it is vital to balance intake versus output. This means the number of calories burned per day must be less than the calories you take in with food and drink.   I recommend trying to follow a diet with the following: Calories: 1200-1500 calories per day Carbohydrates: 150-180 grams of carbohydrates per day  Why: Gives your body enough "quick fuel" for cells to maintain normal function without sending them into starvation mode.  Protein: At least 90 grams of protein per day- 30 grams with each meal Why: Protein takes longer and uses more energy than carbohydrates to break down for fuel. The carbohydrates in your meals serves as quick energy sources and proteins help use some of that extra quick energy to break down to produce long term energy. This helps you not feel hungry as quickly and protein breakdown burns calories.  Water: Drink AT LEAST 64 ounces of water per day  Why: Water is essential to healthy metabolism. Water helps to fill the stomach and keep you fuller longer. Water is required for healthy digestion and filtering of waste in the body.  Fat: Limit fats in your diet- when choosing fats, choose foods with lower fats content such as lean meats (chicken, fish, [place]).  Why: Increased fat intake leads to storage "for later". Once you burn your carbohydrate energy, your body goes into fat and protein breakdown mode to help you loose weight.  Cholesterol: Fats and oils that are LIQUID at room temperature are best. Choose vegetable oils (olive oil, avocado oil, nuts). Avoid fats that are SOLID at room temperature (animal fats, processed meats). Healthy fats are often found in whole grains, beans, nuts, seeds, and berries.  Why: Elevated cholesterol levels lead to build up of cholesterol on the inside of your blood vessels. This will eventually cause the blood vessels to become hard and can lead to high blood pressure and damage to your organs. When the blood  flow is reduced, but the pressure is high from cholesterol buildup, parts of the cholesterol can break off and form clots that can go to the brain or heart leading to a stroke or heart attack.  Fiber: Increase amount of SOLUBLE the fiber in your diet. This helps to fill you up, lowers cholesterol, and helps with digestion. Some foods high in soluble fiber are oats, peas, beans, apples, carrots, barley, and citrus fruits.   Why: Fiber fills you up, helps remove excess cholesterol, and aids in healthy digestion which are all very important in weight management.   I recommend the following as a minimum activity routine: Purposeful walk or other physical activity at least 20 minutes every single day. This means purposefully taking a walk, jog, bike, swim, treadmill, elliptical, dance, etc.  This activity should be ABOVE your normal daily activities, such as walking at work. Goal exercise should be at least 150 minutes a week- work your way up to this.   Heart Rate: Your maximum exercise heart rate should be 220 - Your Age in Years. When exercising, get your heart rate up, but avoid going over the maximum targeted heart rate.  60-70% of your maximum heart rate is where you tend to burn the most fat. To find this number:  220 - Age In Years= Max HR  Max HR x 0.6 (or 0.7) = Fat Burning HR The Fat Burning HR is your goal heart rate while working out to burn the most fat.  NEVER exercise to the point your  feel lightheaded, weak, nauseated, dizzy. If you experience ANY of these symptoms- STOP exercise! Allow yourself to cool down and your heart rate to come down. Then restart slower next time.  If at ANY TIME you feel chest pain or chest pressure during exercise, STOP IMMEDIATELY and seek medical attention.

## 2023-04-21 NOTE — Assessment & Plan Note (Signed)
Chronic. BP stable No alarm symptoms present at this time.  taking as prescribed. Goal blood pressure less than less than 130/80. Currently is not followed with cardiology.  Plan: current treatment plan is effective, no change in therapy, orders and follow up as documented in EpicCare.  Refills have not been provided today. Recommend at home monitoring, especially when headaches are present to ensure that blood pressure is not elevated.  Labs today to check electrolytes and kidney function.  Will make changes to plan of care as necessary based on lab results.  Plan to follow-up in 3months.

## 2023-04-21 NOTE — Assessment & Plan Note (Signed)
Vaginal pruritus with plaque like presence reported on the labia. Symptoms appear consistent with a lichenification. Will send treatment with topical steroid to see if this is helpful. She will need GYN referral for bx if this continues.

## 2023-04-21 NOTE — Assessment & Plan Note (Signed)
Chronic. Lipids not well controlled. No alarm symptoms present at this time. LDL Goal < 70. Controlled with  rosuvastatin .  Plan: Continue current lipid-lowering therapy. orders written for new lab studies as appropriate; see orders and based on newest labs will determine changes needed Recommend Treatment of diabetes , Treatment of hypertension , and Strict diet and exercise Diet and exercise recommendations provided.  Follow-up in 3months or sooner based on lab findings as appropriate.

## 2023-04-21 NOTE — Assessment & Plan Note (Signed)
Chronic. BG waxing and waning course. Associated with Hypertension and Hyperlipidemia. Mild symptoms of headaches present today. Currently managed with Trulicity, although she feels the medication is no longer working for her. Foot Exam HM Status: is up to date. Urine Micro HM Status: is up to date. A1c HM Status: is up to date. CMP HM Status: is up to date. Lipids HM Status: is up to date. Eye Exam HM Status: is reported as completed, but records are needed. Plan: Glucose monitoring RECOMMEND at least ONCE daily with goals for AM fasting blood sugar 70-110 and 2 hours after meals blood sugar <150.  Diet should include 150-180 grams of carbohydrates, 20-30 grams of protein, 30 grams of fiber, and no more than 15 grams of saturated fat per day.  Exercise goals should include an average of 150 minutes of moderate intensity activity per week.  Blood pressure monitoring weekly with goal <130/80. Change in  diabetes medications. Will send in for [facility] given the lack of efficacy seen with Trulicity. If unable to get coverage, will switch to Ozempic.  Labs ordered. Plan to follow-up in 3months

## 2023-04-22 ENCOUNTER — Encounter: Payer: Self-pay | Admitting: Nurse Practitioner

## 2023-04-28 ENCOUNTER — Encounter: Payer: Self-pay | Admitting: Nurse Practitioner

## 2023-04-28 MED ORDER — MOUNJARO 5 MG/0.5ML ~~LOC~~ SOAJ: 5.0000 mg | SUBCUTANEOUS | 0 refills | Status: DC

## 2023-04-28 NOTE — Addendum Note (Signed)
Addended by: Kemaya Dorner, Huntley Dec E on: 04/28/2023 07:52 AM   Modules accepted: Orders

## 2023-05-01 ENCOUNTER — Encounter: Payer: Self-pay | Admitting: Nurse Practitioner

## 2023-05-01 ENCOUNTER — Telehealth: Payer: Self-pay | Admitting: Nurse Practitioner

## 2023-05-01 NOTE — Telephone Encounter (Signed)
Carlynn Spry approved til 04/21/24, sent mychart message

## 2023-05-12 ENCOUNTER — Other Ambulatory Visit: Payer: Self-pay

## 2023-05-12 DIAGNOSIS — E1169 Type 2 diabetes mellitus with other specified complication: Secondary | ICD-10-CM

## 2023-05-12 DIAGNOSIS — E1159 Type 2 diabetes mellitus with other circulatory complications: Secondary | ICD-10-CM

## 2023-05-12 MED ORDER — MOUNJARO 5 MG/0.5ML ~~LOC~~ SOAJ: 5.0000 mg | SUBCUTANEOUS | 0 refills | Status: DC

## 2023-06-20 ENCOUNTER — Encounter: Payer: Self-pay | Admitting: Nurse Practitioner

## 2023-06-22 ENCOUNTER — Other Ambulatory Visit: Payer: Self-pay

## 2023-06-22 DIAGNOSIS — E1159 Type 2 diabetes mellitus with other circulatory complications: Secondary | ICD-10-CM

## 2023-06-22 DIAGNOSIS — E1169 Type 2 diabetes mellitus with other specified complication: Secondary | ICD-10-CM

## 2023-06-22 MED ORDER — MOUNJARO 5 MG/0.5ML ~~LOC~~ SOAJ: 5.0000 mg | SUBCUTANEOUS | 1 refills | Status: DC

## 2023-07-30 DIAGNOSIS — Z1231 Encounter for screening mammogram for malignant neoplasm of breast: Secondary | ICD-10-CM | POA: Diagnosis not present

## 2023-10-01 ENCOUNTER — Other Ambulatory Visit: Payer: Self-pay | Admitting: Nurse Practitioner

## 2023-10-01 DIAGNOSIS — E1169 Type 2 diabetes mellitus with other specified complication: Secondary | ICD-10-CM

## 2023-10-01 DIAGNOSIS — I10 Essential (primary) hypertension: Secondary | ICD-10-CM

## 2023-10-01 DIAGNOSIS — E1165 Type 2 diabetes mellitus with hyperglycemia: Secondary | ICD-10-CM

## 2023-11-13 ENCOUNTER — Telehealth: Payer: Self-pay | Admitting: Nurse Practitioner

## 2023-11-13 NOTE — Telephone Encounter (Signed)
 [facility]  Mounjaro pen 0.5 ml  4's  5 mg

## 2023-11-18 NOTE — Telephone Encounter (Signed)
 We need labs to check and see where her blood sugars are. She should be moving up, most likely.

## 2023-11-19 ENCOUNTER — Other Ambulatory Visit: Payer: Self-pay

## 2023-11-19 DIAGNOSIS — E1169 Type 2 diabetes mellitus with other specified complication: Secondary | ICD-10-CM

## 2023-11-20 ENCOUNTER — Other Ambulatory Visit: Payer: Self-pay

## 2023-11-20 DIAGNOSIS — E1169 Type 2 diabetes mellitus with other specified complication: Secondary | ICD-10-CM

## 2023-11-26 ENCOUNTER — Other Ambulatory Visit

## 2023-11-26 DIAGNOSIS — E1169 Type 2 diabetes mellitus with other specified complication: Secondary | ICD-10-CM

## 2023-11-26 LAB — CMP14+EGFR
ALT: 8 IU/L (ref 0–32)
AST: 15 IU/L (ref 0–40)
Albumin: 4.2 g/dL (ref 3.9–4.9)
Alkaline Phosphatase: 77 IU/L (ref 44–121)
BUN/Creatinine Ratio: 14 (ref 9–23)
BUN: 14 mg/dL (ref 6–24)
Bilirubin Total: <0.2 mg/dL (ref 0.0–1.2)
CO2: 23 mmol/L (ref 20–29)
Calcium: 9 mg/dL (ref 8.7–10.2)
Chloride: 103 mmol/L (ref 96–106)
Creatinine, Ser: 1.01 mg/dL — ABNORMAL HIGH (ref 0.57–1.00)
Globulin, Total: 2.9 g/dL (ref 1.5–4.5)
Glucose: 96 mg/dL (ref 70–99)
Potassium: 4.3 mmol/L (ref 3.5–5.2)
Sodium: 140 mmol/L (ref 134–144)
Total Protein: 7.1 g/dL (ref 6.0–8.5)
eGFR: 68 mL/min/{1.73_m2} (ref >59)

## 2023-11-26 LAB — HEMOGLOBIN A1C
Est. average glucose Bld gHb Est-mCnc: 137 mg/dL
Hgb A1c MFr Bld: 6.4 % — ABNORMAL HIGH (ref 4.8–5.6)

## 2023-11-30 ENCOUNTER — Encounter: Payer: Self-pay | Admitting: Nurse Practitioner

## 2023-12-01 ENCOUNTER — Other Ambulatory Visit: Payer: Self-pay

## 2023-12-01 MED ORDER — TIRZEPATIDE 7.5 MG/0.5ML ~~LOC~~ SOAJ: 7.5000 mg | SUBCUTANEOUS | 1 refills | Status: DC

## 2023-12-02 ENCOUNTER — Encounter: Payer: Self-pay | Admitting: Nurse Practitioner

## 2023-12-14 ENCOUNTER — Other Ambulatory Visit: Payer: Self-pay | Admitting: Nurse Practitioner

## 2023-12-14 DIAGNOSIS — E1165 Type 2 diabetes mellitus with hyperglycemia: Secondary | ICD-10-CM

## 2023-12-14 DIAGNOSIS — E559 Vitamin D deficiency, unspecified: Secondary | ICD-10-CM

## 2023-12-14 DIAGNOSIS — I10 Essential (primary) hypertension: Secondary | ICD-10-CM

## 2024-01-20 ENCOUNTER — Encounter: Payer: Self-pay | Admitting: Nurse Practitioner

## 2024-01-21 ENCOUNTER — Other Ambulatory Visit: Payer: Self-pay

## 2024-01-21 MED ORDER — TIRZEPATIDE 7.5 MG/0.5ML ~~LOC~~ SOAJ: 7.5000 mg | SUBCUTANEOUS | 0 refills | Status: DC

## 2024-02-03 ENCOUNTER — Telehealth: Payer: Self-pay

## 2024-02-03 ENCOUNTER — Other Ambulatory Visit: Payer: Self-pay

## 2024-02-03 DIAGNOSIS — E1169 Type 2 diabetes mellitus with other specified complication: Secondary | ICD-10-CM

## 2024-02-03 DIAGNOSIS — E1165 Type 2 diabetes mellitus with hyperglycemia: Secondary | ICD-10-CM

## 2024-02-03 DIAGNOSIS — I10 Essential (primary) hypertension: Secondary | ICD-10-CM

## 2024-02-03 MED ORDER — ROSUVASTATIN CALCIUM 10 MG PO TABS: 10.0000 mg | ORAL_TABLET | ORAL | 1 refills | Status: DC

## 2024-02-03 NOTE — Telephone Encounter (Signed)
 sent

## 2024-02-03 NOTE — Addendum Note (Signed)
 Addended by: Spyridon Hornstein, CAMIE E on: 02/03/2024 03:43 PM   Modules accepted: Orders

## 2024-02-03 NOTE — Telephone Encounter (Signed)
 New RX Request: Rosuvastatin  Tabs 10mg   Using [facility] instead of [facility] for this prescription

## 2024-02-10 ENCOUNTER — Ambulatory Visit: Admitting: Nurse Practitioner

## 2024-02-10 ENCOUNTER — Encounter: Payer: Self-pay | Admitting: Nurse Practitioner

## 2024-02-10 VITALS — BP 148/88 | HR 88 | Ht 67.0 in | Wt 154.4 lb

## 2024-02-10 DIAGNOSIS — I152 Hypertension secondary to endocrine disorders: Secondary | ICD-10-CM | POA: Diagnosis not present

## 2024-02-10 DIAGNOSIS — M722 Plantar fascial fibromatosis: Secondary | ICD-10-CM

## 2024-02-10 DIAGNOSIS — N926 Irregular menstruation, unspecified: Secondary | ICD-10-CM

## 2024-02-10 DIAGNOSIS — E1165 Type 2 diabetes mellitus with hyperglycemia: Secondary | ICD-10-CM | POA: Diagnosis not present

## 2024-02-10 DIAGNOSIS — E1159 Type 2 diabetes mellitus with other circulatory complications: Secondary | ICD-10-CM | POA: Diagnosis not present

## 2024-02-10 DIAGNOSIS — R7989 Other specified abnormal findings of blood chemistry: Secondary | ICD-10-CM | POA: Diagnosis not present

## 2024-02-10 DIAGNOSIS — I10 Essential (primary) hypertension: Secondary | ICD-10-CM

## 2024-02-10 DIAGNOSIS — E785 Hyperlipidemia, unspecified: Secondary | ICD-10-CM

## 2024-02-10 DIAGNOSIS — E559 Vitamin D deficiency, unspecified: Secondary | ICD-10-CM

## 2024-02-10 DIAGNOSIS — Z0000 Encounter for general adult medical examination without abnormal findings: Secondary | ICD-10-CM

## 2024-02-10 DIAGNOSIS — E1169 Type 2 diabetes mellitus with other specified complication: Secondary | ICD-10-CM | POA: Diagnosis not present

## 2024-02-10 DIAGNOSIS — B360 Pityriasis versicolor: Secondary | ICD-10-CM

## 2024-02-10 MED ORDER — AMLODIPINE BESYLATE 2.5 MG PO TABS: 2.5000 mg | ORAL_TABLET | Freq: Every day | ORAL | 3 refills | Status: AC

## 2024-02-10 NOTE — Progress Notes (Signed)
 Catheline Doing, DNP, AGNP-c [facility] [address] [place], [place] [address] Main Office [phone]  BP (!) 148/88   Pulse 88   Ht 5' 7 (1.702 m)   Wt 154 lb 6.4 oz (70 kg)   LMP 01/17/2024   SpO2 98%   BMI 24.18 kg/m    Subjective:    Patient ID: Sylvia Robinson, female    DOB: Apr 02, 1971, 53 y.o.   MRN: [id number]  HPI: History of Present Illness Jewelle Whitner is a 53 year old female with type 2 diabetes and hypertension who presents for medication management and follow-up.  She is currently on Mounjaro  7.5 mg for type 2 diabetes. Her blood sugar levels have been well-controlled, with a recent reading of 97 mg/dL. She experienced a delay in receiving her medication from [facility], resulting in a week without the medication. During this time, she did not experience significant hunger and her blood sugar remained stable. However, after resuming the medication, she noticed increased hunger and changes in bowel habits, including bloating and slowed bowel movements, which she describes as 'weird side effects.'  She has a history of hypertension and monitors her blood pressure at home. She has missed doses of her blood pressure medication, which she last took on "Sunday, but took it again on the morning of the visit. Her blood pressure is usually 'pretty decent' when she takes her medication regularly.  She mentions a sensation of tenderness in her right heel, which she describes as feeling 'a little tender' when stepping. 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  She experiences seasonal itching around May each year, which she attributes to heat. She uses a medicated body wash to alleviate the symptoms, which she describes as 'itching really bad' when she gets hot. The symptoms have resolved with the use of the body wash.  Her menstrual cycles have improved  since switching from Trulicity to Mounjaro. Previously, she experienced heavy cycles with large clots, which she attributes to Trulicity. She has a history of fibroids and reports that her cycles are now more regular and steady.  She has not had an eye exam in over a year and acknowledges the need to schedule one. She also mentions needing to visit the dentist.  Pertinent items are noted in HPI.  Most Recent Depression Screen:     02/10/2024    9:36 AM 06/19/2022    8:36 AM 10/16/2021   12:33 PM 07/17/2021   12:14 PM  Depression screen PHQ 2/9  Decreased Interest 0 0 0 0  Down, Depressed, Hopeless 0 0 0 0  PHQ - 2 Score 0 0 0 0  Altered sleeping  0    Tired, decreased energy  1    Change in appetite  1    Feeling bad or failure about yourself   0    Trouble concentrating  0    Moving slowly or fidgety/restless  0    Suicidal thoughts  0    PHQ-9 Score  2    Difficult doing work/chores  Not difficult at all Not difficult at all    Most Recent Anxiety Screen:     11" /04/2022    8:37 AM  GAD 7 : Generalized Anxiety Score  Nervous, Anxious, on Edge 0  Control/stop worrying 0  Worry too much - different things 0  Trouble relaxing 0  Restless 0  Easily  annoyed or irritable 0  Afraid - awful might happen 0  Total GAD 7 Score 0  Anxiety Difficulty Not difficult at all   Most Recent Fall Screen:    02/10/2024    9:35 AM 10/16/2022   10:21 AM 06/19/2022    8:35 AM 10/16/2021   12:33 PM 07/17/2021   12:13 PM  Fall Risk   Falls in the past year? 0 0 0 0 0  Number falls in past yr: 0 0 0 0 0  Injury with Fall? 0 0 0 0 0  Risk for fall due to : No Fall Risks No Fall Risks No Fall Risks No Fall Risks No Fall Risks  Follow up Falls evaluation completed Falls evaluation completed Education provided;Falls evaluation completed  Falls evaluation completed  Falls evaluation completed;Education provided      Data saved with a previous flowsheet row definition    Past medical history, surgical  history, medications, allergies, family history and social history reviewed with patient today and changes made to appropriate areas of the chart.  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   Omega-3 Fatty Acids (FISH OIL) 500 MG CAPS Take by mouth.   rosuvastatin  (CRESTOR ) 10 MG tablet Take 1 tablet (10 mg total) by mouth every other day.   tirzepatide  (MOUNJARO ) 7.5 MG/0.5ML Pen Inject 7.5 mg into the skin once a week.   vitamin C (ASCORBIC ACID) 250 MG tablet Take 250 mg by mouth daily.   clobetasol  cream (TEMOVATE ) 0.05 % Apply 1 Application topically 2 (two) times daily. Apply in a very thin layer. (Patient not taking: Reported on 02/10/2024)   Continuous Blood Gluc Sensor (FREESTYLE LIBRE 3 SENSOR) MISC 1 Units by Does not apply route every 14 (fourteen) days. Place 1 sensor on the skin every 14 days. Use to check glucose continuously (Patient not taking: Reported on 02/10/2024)   No current facility-administered medications on file prior to visit.   Surgical History:  Past Surgical History:  Procedure Laterality Date   APPENDECTOMY N/A 11/22/2019   Procedure: APPENDECTOMY AND REPAIR OF MESENTARY DEFECT;  Surgeon: Barbette Knock, MD;  Location: [facility];  Service: Gynecology;  Laterality: N/A;  DR TANDA 'S PROCEDURE   MYOMECTOMY N/A 11/22/2019   Procedure: ABDOMINAL MYOMECTOMY, ENTEROLYSIS;  Surgeon: Barbette Knock, MD;  Location: [facility];  Service: Gynecology;  Laterality: N/A;  TAP BLOCK BY ANESTHESIOLOGIST PREOP   WISDOM TOOTH EXTRACTION      Allergies:  No Known Allergies Family History:  No family history on file.     Objective:    BP (!) 148/88   Pulse 88   Ht 5' 7 (1.702 m)   Wt 154 lb 6.4 oz (70 kg)   LMP 01/17/2024   SpO2 98%   BMI 24.18 kg/m   Wt Readings from Last 3 Encounters:  02/10/24 154 lb 6.4 oz (70 kg)  04/20/23 164 lb 3.2 oz (74.5 kg)  10/16/22 161 lb 9.6 oz (73.3 kg)    Physical Exam Vitals and nursing note reviewed.  Constitutional:  General: She is not in acute distress.    Appearance: Normal appearance. She is normal weight.  HENT:     Head: Normocephalic and atraumatic.     Right Ear: Hearing, tympanic membrane, ear canal and external ear normal.     Left Ear: Hearing, tympanic membrane, ear canal and external ear normal.     Nose: Nose normal.     Right Sinus: No maxillary sinus tenderness or frontal sinus tenderness.     Left Sinus: No maxillary sinus tenderness or frontal sinus tenderness.     Mouth/Throat:     Lips: Pink.     Mouth: Mucous membranes are moist.     Pharynx: Oropharynx is clear.  Eyes:     General: Lids are normal. Vision grossly intact.     Extraocular Movements: Extraocular movements intact.     Conjunctiva/sclera: Conjunctivae normal.     Pupils: Pupils are equal, round, and reactive to light.     Funduscopic exam:    Right eye: Red reflex present.        Left eye: Red reflex present.    Visual Fields: Right eye visual fields normal and left eye visual fields normal.  Neck:     Thyroid : No thyromegaly.     Vascular: No carotid bruit.  Cardiovascular:     Rate and Rhythm: Normal rate and regular rhythm.     Chest Wall: PMI is not displaced.     Pulses: Normal pulses.          Dorsalis pedis pulses are 2+ on the right side and 2+ on the left side.       Posterior tibial pulses are 2+ on the right side and 2+ on the left side.     Heart sounds: Normal heart sounds. No murmur heard. Pulmonary:     Effort: Pulmonary effort is normal. No respiratory  distress.     Breath sounds: Normal breath sounds.  Abdominal:     General: Abdomen is flat. Bowel sounds are normal. There is no distension.     Palpations: Abdomen is soft. There is no hepatomegaly, splenomegaly or mass.     Tenderness: There is no abdominal tenderness. There is no right CVA tenderness, left CVA tenderness, guarding or rebound.  Musculoskeletal:        General: Normal range of motion.     Cervical back: Full passive range of motion without pain, normal range of motion and neck supple. No tenderness.     Right lower leg: No edema.     Left lower leg: No edema.  Feet:     Left foot:     Toenail Condition: Left toenails are normal.  Lymphadenopathy:     Cervical: No cervical adenopathy.     Upper Body:     Right upper body: No supraclavicular adenopathy.     Left upper body: No supraclavicular adenopathy.  Skin:    General: Skin is warm and dry.     Capillary Refill: Capillary refill takes less than 2 seconds.     Nails: There is no clubbing.  Neurological:     General: No focal deficit present.     Mental Status: She is alert and oriented to person, place, and time.     GCS: GCS eye subscore is 4. GCS verbal subscore is 5. GCS motor subscore is 6.     Sensory: Sensation is intact. No sensory deficit.     Motor: Motor function is intact. No weakness.  Coordination: Coordination is intact. Coordination normal.     Gait: Gait is intact. Gait normal.     Deep Tendon Reflexes: Reflexes are normal and symmetric.  Psychiatric:        Attention and Perception: Attention normal.        Mood and Affect: Mood normal.        Speech: Speech normal.        Behavior: Behavior normal. Behavior is cooperative.        Thought Content: Thought content normal.        Cognition and Memory: Cognition and memory normal.        Judgment: Judgment normal.      Results for orders placed or performed in visit on 02/10/24  CBC with Differential/Platelet   Collection Time:  02/10/24 10:40 AM  Result Value Ref Range   WBC 5.8 3.4 - 10.8 x10E3/uL   RBC 4.33 3.77 - 5.28 x10E6/uL   Hemoglobin 12.4 11.1 - 15.9 g/dL   Hematocrit 61.9 65.9 - 46.6 %   MCV 88 79 - 97 fL   MCH 28.6 26.6 - 33.0 pg   MCHC 32.6 31.5 - 35.7 g/dL   RDW 86.9 88.2 - 84.5 %   Platelets 396 150 - 450 x10E3/uL   Neutrophils 61 Not Estab. %   Lymphs 28 Not Estab. %   Monocytes 8 Not Estab. %   Eos 2 Not Estab. %   Basos 1 Not Estab. %   Neutrophils Absolute 3.5 1.4 - 7.0 x10E3/uL   Lymphocytes Absolute 1.6 0.7 - 3.1 x10E3/uL   Monocytes Absolute 0.5 0.1 - 0.9 x10E3/uL   EOS (ABSOLUTE) 0.1 0.0 - 0.4 x10E3/uL   Basophils Absolute 0.0 0.0 - 0.2 x10E3/uL   Immature Granulocytes 0 Not Estab. %   Immature Grans (Abs) 0.0 0.0 - 0.1 x10E3/uL  Comprehensive metabolic panel with GFR   Collection Time: 02/10/24 10:40 AM  Result Value Ref Range   Glucose 96 70 - 99 mg/dL   BUN 12 6 - 24 mg/dL   Creatinine, Ser 8.99 0.57 - 1.00 mg/dL   eGFR 69 >40 fO/fpw/8.26   BUN/Creatinine Ratio 12 9 - 23   Sodium 139 134 - 144 mmol/L   Potassium 4.0 3.5 - 5.2 mmol/L   Chloride 103 96 - 106 mmol/L   CO2 21 20 - 29 mmol/L   Calcium  9.2 8.7 - 10.2 mg/dL   Total Protein 7.2 6.0 - 8.5 g/dL   Albumin  4.4 3.9 - 4.9 g/dL   Globulin, Total 2.8 1.5 - 4.5 g/dL   Bilirubin Total <9.7 0.0 - 1.2 mg/dL   Alkaline Phosphatase 75 44 - 121 IU/L   AST 10 0 - 40 IU/L   ALT 9 0 - 32 IU/L  Hemoglobin A1c   Collection Time: 02/10/24 10:40 AM  Result Value Ref Range   Hgb A1c MFr Bld 5.9 (H) 4.8 - 5.6 %   Est. average glucose Bld gHb Est-mCnc 123 mg/dL  Lipid panel   Collection Time: 02/10/24 10:40 AM  Result Value Ref Range   Cholesterol, Total 151 100 - 199 mg/dL   Triglycerides 59 0 - 149 mg/dL   HDL 54 >60 mg/dL   VLDL Cholesterol Cal 12 5 - 40 mg/dL   LDL Chol Calc (NIH) 85 0 - 99 mg/dL   Chol/HDL Ratio 2.8 0.0 - 4.4 ratio  Microalbumin / creatinine urine ratio   Collection Time: 02/10/24 10:40 AM  Result  Value Ref Range   Creatinine, Urine  222.1 Not Estab. mg/dL   Microalbumin, Urine 88.4 Not Estab. ug/mL   Microalb/Creat Ratio 5 0 - 29 mg/g creat       Assessment & Plan:   Problem List Items Addressed This Visit     Type 2 diabetes mellitus with hyperglycemia (HCC)   Controlled with mounjaro  without worrisome symptoms. Continue current regimen. Increase dose monthly as tolerated.       Relevant Medications   amLODipine  (NORVASC ) 2.5 MG tablet   Other Relevant Orders   CBC with Differential/Platelet (Completed)   Comprehensive metabolic panel with GFR (Completed)   Hemoglobin A1c (Completed)   Lipid panel (Completed)   Microalbumin / creatinine urine ratio (Completed)   Hyperlipidemia associated with type 2 diabetes mellitus (HCC)   Managed with rosuvastatin . No alarm symptoms. Labs pending.       Relevant Medications   amLODipine  (NORVASC ) 2.5 MG tablet   Other Relevant Orders   CBC with Differential/Platelet (Completed)   Comprehensive metabolic panel with GFR (Completed)   Hemoglobin A1c (Completed)   Lipid panel (Completed)   Microalbumin / creatinine urine ratio (Completed)   Encounter for annual physical exam   CPE completed today. Review of HM activities and recommendations discussed and provided on AVS. Anticipatory guidance, diet, and exercise recommendations provided. Medications, allergies, and hx reviewed and updated as necessary. Orders placed as listed below.  Plan: - Labs ordered. Will make changes as necessary based on results.  - I will review these results and send recommendations via MyChart or a telephone call.  - F/U with CPE in 1 year or sooner for acute/chronic health needs as directed.        Vitamin D  deficiency   Relevant Medications   amLODipine  (NORVASC ) 2.5 MG tablet   Other Relevant Orders   CBC with Differential/Platelet (Completed)   Comprehensive metabolic panel with GFR (Completed)   Hemoglobin A1c (Completed)   Lipid panel  (Completed)   Microalbumin / creatinine urine ratio (Completed)   Elevated platelet count   Etiology unknown. Repeat labs for monitoring.       Relevant Orders   CBC with Differential/Platelet (Completed)   Comprehensive metabolic panel with GFR (Completed)   Hemoglobin A1c (Completed)   Lipid panel (Completed)   Microalbumin / creatinine urine ratio (Completed)   Hypertension associated with diabetes (HCC)   Relevant Medications   amLODipine  (NORVASC ) 2.5 MG tablet   Other Relevant Orders   CBC with Differential/Platelet (Completed)   Comprehensive metabolic panel with GFR (Completed)   Hemoglobin A1c (Completed)   Lipid panel (Completed)   Microalbumin / creatinine urine ratio (Completed)   Other Visit Diagnoses       Plantar fasciitis    -  Primary     Menstrual irregularity         Tinea versicolor           Type 2 Diabetes Mellitus She is on Mounjaro  7.5 mg with good glycemic control, recent readings 97-100 mg/dL. Experienced intense hunger after resuming Mounjaro  post a week off, likely due to the medication's long half-life. Prefers Mounjaro  over Trulicity  due to fewer side effects and better glycemic control. Discussed extending dosing interval to every 9-10 days if effective. - Continue Mounjaro  7.5 mg with the option to extend dosing interval to every 9-10 days if effective. - Monitor blood glucose levels regularly. - Ensure small, frequent meals to prevent hypoglycemia.  Hypertension Blood pressure slightly elevated, possibly due to missed antihypertensive doses. Usually reports decent readings at home. Last  took medication on Sunday, resumed on the day of the visit. Advised to monitor blood pressure over the weekend and report readings on Monday. - Take antihypertensive medication as prescribed. - Monitor blood pressure over the weekend and report readings on Monday.  Plantar Fasciitis Reports heel tenderness, likely due to plantar fasciitis. Symptoms include  tenderness when stepping, likely due to plantar fascia inflammation. Advised to wear shoes with good arch support and perform calf muscle stretching exercises. - Wear shoes with good arch support. - Perform calf muscle stretching exercises.  Tinea Versicolor Experiences seasonal itching, likely due to tinea versicolor, exacerbated by warm weather. Symptoms improve with non-fragranced, medicated body wash. Discussed using Nizoral shampoo as a body wash if symptoms recur. - Continue using Aveeno or similar therapeutic body wash. - Consider using Nizoral shampoo as a body wash if symptoms recur.  Menstrual Irregularities Reports improved menstrual cycle regularity and flow since switching from Trulicity  to Mounjaro . Previously experienced heavy cycles and clotting, possibly due to Trulicity . Plans to discuss menstrual changes with gynecologist during upcoming Pap smear appointment. - Discuss menstrual changes with gynecologist during upcoming Pap smear appointment.   Follow up plan: Return in about 6 months (around 08/12/2024) for Med Management 30_DM, HTN.  NEXT PREVENTATIVE PHYSICAL DUE IN 1 YEAR.  PATIENT COUNSELING PROVIDED FOR ALL ADULT PATIENTS: A well balanced diet low in saturated fats, cholesterol, and moderation in carbohydrates.  This can be as simple as monitoring portion sizes and cutting back on sugary beverages such as soda and juice to start with.    Daily water consumption of at least 64 ounces.  Physical activity at least 180 minutes per week.  If just starting out, start 10 minutes a day and work your way up.   This can be as simple as taking the stairs instead of the elevator and walking 2-3 laps around the office  purposefully every day.   STD protection, partner selection, and regular testing if high risk.  Limited consumption of alcoholic beverages if alcohol is consumed. For men, I recommend no more than 14 alcoholic beverages per week, spread out throughout the  week (max 2 per day). Avoid binge drinking or consuming large quantities of alcohol in one setting.  Please let me know if you feel you may need help with reduction or quitting alcohol consumption.   Avoidance of nicotine, if used. Please let me know if you feel you may need help with reduction or quitting nicotine use.   Daily mental health attention. This can be in the form of 5 minute daily meditation, prayer, journaling, yoga, reflection, etc.  Purposeful attention to your emotions and mental state can significantly improve your overall wellbeing  and  Health.  Please know that I am here to help you with all of your health care goals and am happy to work with you to find a solution that works best for you.  The greatest advice I have received with any changes in life are to take it one step at a time, that even means if all you can focus on is the next 60 seconds, then do that and celebrate your victories.  With any changes in life, you will have set backs, and that is OK. The important thing to remember is, if you have a set back, it is not a failure, it is an opportunity to try again! Screening Testing Mammogram Every 1 -2 years based on history and risk factors Starting at age 48 Pap Smear Ages 21-39  every 3 years Ages 44-65 every 5 years with HPV testing More frequent testing may be required based on results and history Colon Cancer Screening Every 1-10 years based on test performed, risk factors, and history Starting at age 5 Bone Density Screening Every 2-10 years based on history Starting at age 4 for women Recommendations for men differ based on medication usage, history, and risk factors AAA Screening One time ultrasound Men 13-63 years old who have every smoked Lung Cancer Screening Low Dose Lung CT every 12 months Age 4-80 years with a 30 pack-year smoking history who still smoke or who have quit within the last 15 years   Screening Labs Routine  Labs:  Complete Blood Count (CBC), Complete Metabolic Panel (CMP), Cholesterol (Lipid Panel) Every 6-12 months based on history and medications May be recommended more frequently based on current conditions or previous results Hemoglobin A1c Lab Every 3-12 months based on history and previous results Starting at age 19 or earlier with diagnosis of diabetes, high cholesterol, BMI >26, and/or risk factors Frequent monitoring for patients with diabetes to ensure blood sugar control Thyroid  Panel (TSH) Every 6 months based on history, symptoms, and risk factors May be repeated more often if on medication HIV One time testing for all patients 61 and older May be repeated more frequently for patients with increased risk factors or exposure Hepatitis C One time testing for all patients 84 and older May be repeated more frequently for patients with increased risk factors or exposure Gonorrhea, Chlamydia Every 12 months for all sexually active persons 13-24 years Additional monitoring may be recommended for those who are considered high risk or who have symptoms Every 12 months for any woman on birth control, regardless of sexual activity PSA Men 31-69 years old with risk factors Additional screening may be recommended from age 18-69 based on risk factors, symptoms, and history  Vaccine Recommendations Tetanus Booster All adults every 10 years Flu Vaccine All patients 6 months and older every year COVID Vaccine All patients 12 years and older Initial dosing with booster May recommend additional booster based on age and health history HPV Vaccine 2 doses all patients age 31-26 Dosing may be considered for patients over 26 Shingles Vaccine (Shingrix) 2 doses all adults 55 years and older Pneumonia (Pneumovax 54) All adults 65 years and older May recommend earlier dosing based on health history One year apart from Prevnar 70 Pneumonia (Prevnar 65) All adults 65 years and older Dosed 1 year  after Pneumovax 23 Pneumonia (Prevnar 20) One time alternative to the two dosing of 13 and 23 For all adults with initial dose of 23, 20 is recommended 1 year later For all adults with initial dose of 13, 23 is still recommended as second option 1 year later

## 2024-02-10 NOTE — Patient Instructions (Addendum)
 [place]  VISIT SUMMARY:  Today, we discussed the management of your type 2 diabetes, hypertension, heel tenderness, seasonal itching, and menstrual irregularities. Your blood sugar levels are well-controlled, and we reviewed your current medications and any side effects you are experiencing. We also talked about your blood pressure management and the importance of regular monitoring. Additionally, we addressed your heel tenderness, seasonal itching, and menstrual cycle improvements.  YOUR PLAN:  -TYPE 2 DIABETES MELLITUS: Type 2 diabetes is a condition where your body does not use insulin  properly, leading to high blood sugar levels. You are currently on Mounjaro  7.5 mg, which has been effective in controlling your blood sugar. If you find it effective, you can extend the dosing interval to every 9-10 days. Continue to monitor your blood glucose levels regularly and eat small, frequent meals to prevent low blood sugar.  -HYPERTENSION: Hypertension, or high blood pressure, is a condition where the force of the blood against your artery walls is too high. You need to take your blood pressure medication as prescribed and monitor your blood pressure over the weekend. Please report your readings on Monday.  -PLANTAR FASCIITIS: Plantar fasciitis is inflammation of the tissue on the bottom of your foot, causing heel pain. To alleviate the tenderness, wear shoes with good arch support and perform calf muscle stretching exercises.  -TINEA VERSICOLOR: Tinea versicolor is a fungal infection that causes discolored patches on the skin, often worse in warm weather. Continue using a therapeutic body wash like Aveeno, and consider using Nizoral shampoo as a body wash if the symptoms come back.  -MENSTRUAL IRREGULARITIES: You have noticed improvements in your menstrual cycle since switching from Trulicity  to Mounjaro . Please discuss these changes with your gynecologist during your upcoming Pap  smear appointment.  INSTRUCTIONS:  Please monitor your blood pressure over the weekend and report your readings on Monday. Additionally, schedule an eye exam and a dental visit as soon as possible.

## 2024-02-11 LAB — COMPREHENSIVE METABOLIC PANEL WITH GFR
ALT: 9 IU/L (ref 0–32)
AST: 10 IU/L (ref 0–40)
Albumin: 4.4 g/dL (ref 3.9–4.9)
Alkaline Phosphatase: 75 IU/L (ref 44–121)
BUN/Creatinine Ratio: 12 (ref 9–23)
BUN: 12 mg/dL (ref 6–24)
Bilirubin Total: <0.2 mg/dL (ref 0.0–1.2)
CO2: 21 mmol/L (ref 20–29)
Calcium: 9.2 mg/dL (ref 8.7–10.2)
Chloride: 103 mmol/L (ref 96–106)
Creatinine, Ser: 1 mg/dL (ref 0.57–1.00)
Globulin, Total: 2.8 g/dL (ref 1.5–4.5)
Glucose: 96 mg/dL (ref 70–99)
Potassium: 4 mmol/L (ref 3.5–5.2)
Sodium: 139 mmol/L (ref 134–144)
Total Protein: 7.2 g/dL (ref 6.0–8.5)
eGFR: 69 mL/min/{1.73_m2} (ref >59)

## 2024-02-11 LAB — CBC WITH DIFFERENTIAL/PLATELET
Basophils Absolute: 0 10*3/uL (ref 0.0–0.2)
Basos: 1 %
EOS (ABSOLUTE): 0.1 10*3/uL (ref 0.0–0.4)
Eos: 2 %
Hematocrit: 38 % (ref 34.0–46.6)
Hemoglobin: 12.4 g/dL (ref 11.1–15.9)
Immature Grans (Abs): 0 10*3/uL (ref 0.0–0.1)
Immature Granulocytes: 0 %
Lymphocytes Absolute: 1.6 10*3/uL (ref 0.7–3.1)
Lymphs: 28 %
MCH: 28.6 pg (ref 26.6–33.0)
MCHC: 32.6 g/dL (ref 31.5–35.7)
MCV: 88 fL (ref 79–97)
Monocytes Absolute: 0.5 10*3/uL (ref 0.1–0.9)
Monocytes: 8 %
Neutrophils Absolute: 3.5 10*3/uL (ref 1.4–7.0)
Neutrophils: 61 %
Platelets: 396 10*3/uL (ref 150–450)
RBC: 4.33 x10E6/uL (ref 3.77–5.28)
RDW: 13 % (ref 11.7–15.4)
WBC: 5.8 10*3/uL (ref 3.4–10.8)

## 2024-02-11 LAB — HEMOGLOBIN A1C
Est. average glucose Bld gHb Est-mCnc: 123 mg/dL
Hgb A1c MFr Bld: 5.9 % — ABNORMAL HIGH (ref 4.8–5.6)

## 2024-02-11 LAB — LIPID PANEL
Chol/HDL Ratio: 2.8 ratio (ref 0.0–4.4)
Cholesterol, Total: 151 mg/dL (ref 100–199)
HDL: 54 mg/dL (ref >39)
LDL Chol Calc (NIH): 85 mg/dL (ref 0–99)
Triglycerides: 59 mg/dL (ref 0–149)
VLDL Cholesterol Cal: 12 mg/dL (ref 5–40)

## 2024-02-11 LAB — MICROALBUMIN / CREATININE URINE RATIO
Creatinine, Urine: 222.1 mg/dL
Microalb/Creat Ratio: 5 mg/g{creat} (ref 0–29)
Microalbumin, Urine: 11.5 ug/mL

## 2024-03-02 ENCOUNTER — Ambulatory Visit: Payer: Self-pay | Admitting: Nurse Practitioner

## 2024-03-02 DIAGNOSIS — R7989 Other specified abnormal findings of blood chemistry: Secondary | ICD-10-CM | POA: Insufficient documentation

## 2024-03-02 DIAGNOSIS — Z0000 Encounter for general adult medical examination without abnormal findings: Secondary | ICD-10-CM | POA: Insufficient documentation

## 2024-03-02 DIAGNOSIS — E559 Vitamin D deficiency, unspecified: Secondary | ICD-10-CM | POA: Insufficient documentation

## 2024-03-02 NOTE — Assessment & Plan Note (Signed)
 Managed with rosuvastatin . No alarm symptoms. Labs pending.

## 2024-03-02 NOTE — Assessment & Plan Note (Signed)
 Etiology unknown. Repeat labs for monitoring.

## 2024-03-02 NOTE — Assessment & Plan Note (Signed)
 Controlled with mounjaro  without worrisome symptoms. Continue current regimen. Increase dose monthly as tolerated.

## 2024-03-02 NOTE — Assessment & Plan Note (Signed)

## 2024-03-23 DIAGNOSIS — Z1331 Encounter for screening for depression: Secondary | ICD-10-CM | POA: Diagnosis not present

## 2024-03-23 DIAGNOSIS — Z01411 Encounter for gynecological examination (general) (routine) with abnormal findings: Secondary | ICD-10-CM | POA: Diagnosis not present

## 2024-03-23 DIAGNOSIS — Z01419 Encounter for gynecological examination (general) (routine) without abnormal findings: Secondary | ICD-10-CM | POA: Diagnosis not present

## 2024-03-23 DIAGNOSIS — Z3169 Encounter for other general counseling and advice on procreation: Secondary | ICD-10-CM | POA: Diagnosis not present

## 2024-03-23 DIAGNOSIS — E288 Other ovarian dysfunction: Secondary | ICD-10-CM | POA: Diagnosis not present

## 2024-04-06 ENCOUNTER — Encounter: Payer: Self-pay | Admitting: Nurse Practitioner

## 2024-04-28 ENCOUNTER — Other Ambulatory Visit: Payer: Self-pay | Admitting: Nurse Practitioner

## 2024-05-04 ENCOUNTER — Telehealth: Payer: Self-pay

## 2024-05-04 ENCOUNTER — Encounter: Payer: Self-pay | Admitting: Nurse Practitioner

## 2024-05-04 ENCOUNTER — Other Ambulatory Visit (HOSPITAL_COMMUNITY): Payer: Self-pay

## 2024-05-04 NOTE — Telephone Encounter (Signed)
 Pharmacy Patient Advocate Encounter   Received notification from Patient Advice Request messages that prior authorization for tirzepatide  (MOUNJARO ) 7.5 MG/0.5ML Pen  is required/requested.   Insurance verification completed.   The patient is insured through [employer] .   Per test claim: PA required; PA submitted to above mentioned insurance via Latent Key/confirmation #/[id number] Status is pending

## 2024-05-05 ENCOUNTER — Other Ambulatory Visit (HOSPITAL_COMMUNITY): Payer: Self-pay

## 2024-05-05 NOTE — Telephone Encounter (Signed)
 Please note addt was required and was completed verbally at [phone]. Reference number [id number]  Pharmacy Patient Advocate Encounter  Received notification from [facility] that Prior Authorization for tirzepatide  (MOUNJARO ) 7.5 MG/0.5ML Pen  has been APPROVED from 8.26.25 to 9.25.26. Ran test claim, Copay is $RTS, RX WAS LAST FILLED VIA MAIL ORDER ON 9.18.25. This test claim was processed through [facility]- copay amounts may vary at other pharmacies due to pharmacy/plan contracts, or as the patient moves through the different stages of their insurance plan.

## 2024-08-01 DIAGNOSIS — Z1231 Encounter for screening mammogram for malignant neoplasm of breast: Secondary | ICD-10-CM | POA: Diagnosis not present

## 2024-08-20 ENCOUNTER — Other Ambulatory Visit: Payer: Self-pay | Admitting: Nurse Practitioner

## 2024-08-20 DIAGNOSIS — E1169 Type 2 diabetes mellitus with other specified complication: Secondary | ICD-10-CM

## 2024-08-20 DIAGNOSIS — I10 Essential (primary) hypertension: Secondary | ICD-10-CM

## 2024-08-20 DIAGNOSIS — E1165 Type 2 diabetes mellitus with hyperglycemia: Secondary | ICD-10-CM
